# Patient Record
Sex: Female | Born: 1980 | Race: White | Hispanic: No | Marital: Single | Smoking: Current every day smoker
Health system: Southern US, Community
[De-identification: ages and names within clinical notes are randomized; demographics above are authoritative.]

## PROBLEM LIST (undated history)

## (undated) ENCOUNTER — Inpatient Hospital Stay (HOSPITAL_COMMUNITY): Payer: Self-pay

## (undated) DIAGNOSIS — Z87448 Personal history of other diseases of urinary system: Secondary | ICD-10-CM

## (undated) DIAGNOSIS — R87629 Unspecified abnormal cytological findings in specimens from vagina: Secondary | ICD-10-CM

## (undated) DIAGNOSIS — F1021 Alcohol dependence, in remission: Secondary | ICD-10-CM

## (undated) DIAGNOSIS — Z3490 Encounter for supervision of normal pregnancy, unspecified, unspecified trimester: Secondary | ICD-10-CM

## (undated) DIAGNOSIS — Z8619 Personal history of other infectious and parasitic diseases: Secondary | ICD-10-CM

## (undated) DIAGNOSIS — Z789 Other specified health status: Secondary | ICD-10-CM

## (undated) DIAGNOSIS — M419 Scoliosis, unspecified: Secondary | ICD-10-CM

## (undated) DIAGNOSIS — Z3493 Encounter for supervision of normal pregnancy, unspecified, third trimester: Secondary | ICD-10-CM

## (undated) HISTORY — PX: ECTOPIC PREGNANCY SURGERY: SHX613

## (undated) HISTORY — DX: Personal history of other diseases of urinary system: Z87.448

## (undated) HISTORY — PX: CHOLECYSTECTOMY: SHX55

## (undated) HISTORY — DX: Alcohol dependence, in remission: F10.21

## (undated) HISTORY — DX: Personal history of other infectious and parasitic diseases: Z86.19

## (undated) HISTORY — PX: NO PAST SURGERIES: SHX2092

---
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
## 2002-08-01 ENCOUNTER — Emergency Department (HOSPITAL_COMMUNITY): Admission: EM | Admit: 2002-08-01 | Discharge: 2002-08-01 | Payer: Self-pay

## 2002-08-01 ENCOUNTER — Encounter: Payer: Self-pay | Admitting: Emergency Medicine

## 2002-09-06 ENCOUNTER — Encounter (INDEPENDENT_AMBULATORY_CARE_PROVIDER_SITE_OTHER): Payer: Self-pay | Admitting: Specialist

## 2002-09-06 ENCOUNTER — Observation Stay (HOSPITAL_COMMUNITY): Admission: RE | Admit: 2002-09-06 | Discharge: 2002-09-06 | Payer: Self-pay | Admitting: General Surgery

## 2002-09-06 ENCOUNTER — Encounter: Payer: Self-pay | Admitting: General Surgery

## 2002-11-16 ENCOUNTER — Emergency Department (HOSPITAL_COMMUNITY): Admission: AD | Admit: 2002-11-16 | Discharge: 2002-11-16 | Payer: Self-pay | Admitting: Emergency Medicine

## 2003-10-15 ENCOUNTER — Emergency Department (HOSPITAL_COMMUNITY): Admission: EM | Admit: 2003-10-15 | Discharge: 2003-10-15 | Payer: Self-pay | Admitting: Emergency Medicine

## 2003-10-17 ENCOUNTER — Emergency Department (HOSPITAL_COMMUNITY): Admission: EM | Admit: 2003-10-17 | Discharge: 2003-10-17 | Payer: Self-pay | Admitting: Emergency Medicine

## 2006-02-23 HISTORY — PX: SALPINGECTOMY: SHX328

## 2007-09-19 ENCOUNTER — Ambulatory Visit (HOSPITAL_COMMUNITY): Admission: AD | Admit: 2007-09-19 | Discharge: 2007-09-19 | Payer: Self-pay | Admitting: Obstetrics and Gynecology

## 2007-09-19 ENCOUNTER — Encounter: Payer: Self-pay | Admitting: Family Medicine

## 2007-09-19 ENCOUNTER — Ambulatory Visit: Payer: Self-pay | Admitting: Family Medicine

## 2007-09-19 ENCOUNTER — Encounter: Payer: Self-pay | Admitting: Emergency Medicine

## 2007-09-28 ENCOUNTER — Inpatient Hospital Stay (HOSPITAL_COMMUNITY): Admission: AD | Admit: 2007-09-28 | Discharge: 2007-09-28 | Payer: Self-pay | Admitting: Obstetrics & Gynecology

## 2007-10-06 ENCOUNTER — Encounter: Payer: Self-pay | Admitting: Obstetrics & Gynecology

## 2007-10-06 ENCOUNTER — Ambulatory Visit: Payer: Self-pay | Admitting: Obstetrics & Gynecology

## 2008-01-18 ENCOUNTER — Encounter: Payer: Self-pay | Admitting: Physician Assistant

## 2008-01-18 ENCOUNTER — Ambulatory Visit: Payer: Self-pay | Admitting: Obstetrics & Gynecology

## 2008-01-18 LAB — CONVERTED CEMR LAB
Chlamydia, DNA Probe: NEGATIVE
GC Probe Amp, Genital: NEGATIVE
HCV Ab: NEGATIVE
Hepatitis B Surface Ag: NEGATIVE

## 2008-06-23 ENCOUNTER — Inpatient Hospital Stay (HOSPITAL_COMMUNITY): Admission: AD | Admit: 2008-06-23 | Discharge: 2008-06-24 | Payer: Self-pay | Admitting: Family Medicine

## 2008-10-18 ENCOUNTER — Ambulatory Visit: Payer: Self-pay | Admitting: Obstetrics & Gynecology

## 2008-11-06 ENCOUNTER — Inpatient Hospital Stay (HOSPITAL_COMMUNITY): Admission: AD | Admit: 2008-11-06 | Discharge: 2008-11-06 | Payer: Self-pay | Admitting: Obstetrics & Gynecology

## 2008-11-18 ENCOUNTER — Emergency Department (HOSPITAL_COMMUNITY): Admission: EM | Admit: 2008-11-18 | Discharge: 2008-11-18 | Payer: Self-pay | Admitting: Emergency Medicine

## 2009-09-16 ENCOUNTER — Inpatient Hospital Stay (HOSPITAL_COMMUNITY): Admission: AD | Admit: 2009-09-16 | Discharge: 2009-09-16 | Payer: Self-pay | Admitting: Obstetrics and Gynecology

## 2009-09-16 ENCOUNTER — Ambulatory Visit: Payer: Self-pay | Admitting: Obstetrics & Gynecology

## 2009-11-28 ENCOUNTER — Ambulatory Visit: Payer: Self-pay | Admitting: Obstetrics & Gynecology

## 2009-11-28 ENCOUNTER — Inpatient Hospital Stay (HOSPITAL_COMMUNITY): Admission: AD | Admit: 2009-11-28 | Discharge: 2009-11-28 | Payer: Self-pay | Admitting: Obstetrics and Gynecology

## 2009-12-02 ENCOUNTER — Inpatient Hospital Stay (HOSPITAL_COMMUNITY): Admission: AD | Admit: 2009-12-02 | Discharge: 2009-12-03 | Payer: Self-pay | Admitting: Obstetrics and Gynecology

## 2009-12-02 ENCOUNTER — Ambulatory Visit: Payer: Self-pay | Admitting: Family

## 2010-02-17 ENCOUNTER — Inpatient Hospital Stay (HOSPITAL_COMMUNITY)
Admission: AD | Admit: 2010-02-17 | Discharge: 2010-02-20 | Payer: Self-pay | Source: Home / Self Care | Attending: Obstetrics and Gynecology | Admitting: Obstetrics and Gynecology

## 2010-05-05 LAB — CBC
HCT: 30.5 % — ABNORMAL LOW (ref 36.0–46.0)
HCT: 35.6 % — ABNORMAL LOW (ref 36.0–46.0)
Hemoglobin: 10.6 g/dL — ABNORMAL LOW (ref 12.0–15.0)
Hemoglobin: 12.6 g/dL (ref 12.0–15.0)
MCH: 30.5 pg (ref 26.0–34.0)
MCH: 30.7 pg (ref 26.0–34.0)
MCHC: 34.8 g/dL (ref 30.0–36.0)
MCHC: 35.4 g/dL (ref 30.0–36.0)
MCV: 86.8 fL (ref 78.0–100.0)
MCV: 87.9 fL (ref 78.0–100.0)
Platelets: 159 10*3/uL (ref 150–400)
Platelets: 205 10*3/uL (ref 150–400)
RBC: 3.47 MIL/uL — ABNORMAL LOW (ref 3.87–5.11)
RBC: 4.1 MIL/uL (ref 3.87–5.11)
RDW: 13.6 % (ref 11.5–15.5)
RDW: 13.9 % (ref 11.5–15.5)
WBC: 16.1 10*3/uL — ABNORMAL HIGH (ref 4.0–10.5)
WBC: 18.1 10*3/uL — ABNORMAL HIGH (ref 4.0–10.5)

## 2010-05-05 LAB — RPR: RPR Ser Ql: NONREACTIVE

## 2010-05-07 LAB — URINALYSIS, ROUTINE W REFLEX MICROSCOPIC
Bilirubin Urine: NEGATIVE
Bilirubin Urine: NEGATIVE
Glucose, UA: NEGATIVE mg/dL
Glucose, UA: NEGATIVE mg/dL
Hgb urine dipstick: NEGATIVE
Hgb urine dipstick: NEGATIVE
Ketones, ur: NEGATIVE mg/dL
Ketones, ur: NEGATIVE mg/dL
Nitrite: NEGATIVE
Nitrite: NEGATIVE
Protein, ur: NEGATIVE mg/dL
Protein, ur: NEGATIVE mg/dL
Specific Gravity, Urine: 1.01 (ref 1.005–1.030)
Specific Gravity, Urine: <1.005 — ABNORMAL LOW (ref 1.005–1.030)
Urobilinogen, UA: 0.2 mg/dL (ref 0.0–1.0)
Urobilinogen, UA: 0.2 mg/dL (ref 0.0–1.0)
pH: 6 (ref 5.0–8.0)
pH: 6 (ref 5.0–8.0)

## 2010-05-10 LAB — URINALYSIS, ROUTINE W REFLEX MICROSCOPIC
Bilirubin Urine: NEGATIVE
Glucose, UA: NEGATIVE mg/dL
Hgb urine dipstick: NEGATIVE
Ketones, ur: NEGATIVE mg/dL
Nitrite: NEGATIVE
Protein, ur: NEGATIVE mg/dL
Specific Gravity, Urine: 1.01 (ref 1.005–1.030)
Urobilinogen, UA: 0.2 mg/dL (ref 0.0–1.0)
pH: 6.5 (ref 5.0–8.0)

## 2010-05-30 LAB — CBC
HCT: 39.2 % (ref 36.0–46.0)
Hemoglobin: 13.5 g/dL (ref 12.0–15.0)
MCHC: 34.4 g/dL (ref 30.0–36.0)
MCV: 92.8 fL (ref 78.0–100.0)
Platelets: 191 10*3/uL (ref 150–400)
RBC: 4.22 MIL/uL (ref 3.87–5.11)
RDW: 12.6 % (ref 11.5–15.5)
WBC: 9.8 10*3/uL (ref 4.0–10.5)

## 2010-05-30 LAB — WET PREP, GENITAL
Clue Cells Wet Prep HPF POC: NONE SEEN
Trich, Wet Prep: NONE SEEN
Yeast Wet Prep HPF POC: NONE SEEN

## 2010-05-30 LAB — URINALYSIS, ROUTINE W REFLEX MICROSCOPIC
Bilirubin Urine: NEGATIVE
Glucose, UA: NEGATIVE mg/dL
Ketones, ur: NEGATIVE mg/dL
Nitrite: NEGATIVE
Protein, ur: NEGATIVE mg/dL
Specific Gravity, Urine: 1.015 (ref 1.005–1.030)
Urobilinogen, UA: 2 mg/dL — ABNORMAL HIGH (ref 0.0–1.0)
pH: 7 (ref 5.0–8.0)

## 2010-05-30 LAB — DIFFERENTIAL
Basophils Absolute: 0 10*3/uL (ref 0.0–0.1)
Basophils Relative: 0 % (ref 0–1)
Eosinophils Absolute: 0.1 10*3/uL (ref 0.0–0.7)
Eosinophils Relative: 1 % (ref 0–5)
Lymphocytes Relative: 25 % (ref 12–46)
Lymphs Abs: 2.5 10*3/uL (ref 0.7–4.0)
Monocytes Absolute: 0.5 10*3/uL (ref 0.1–1.0)
Monocytes Relative: 5 % (ref 3–12)
Neutro Abs: 6.7 10*3/uL (ref 1.7–7.7)
Neutrophils Relative %: 68 % (ref 43–77)

## 2010-05-30 LAB — GC/CHLAMYDIA PROBE AMP, GENITAL
Chlamydia, DNA Probe: NEGATIVE
GC Probe Amp, Genital: NEGATIVE

## 2010-05-30 LAB — POCT PREGNANCY, URINE: Preg Test, Ur: NEGATIVE

## 2010-05-30 LAB — URINE MICROSCOPIC-ADD ON

## 2010-06-03 LAB — URINALYSIS, ROUTINE W REFLEX MICROSCOPIC
Bilirubin Urine: NEGATIVE
Glucose, UA: NEGATIVE mg/dL
Ketones, ur: 15 mg/dL — AB
Nitrite: POSITIVE — AB
Protein, ur: 100 mg/dL — AB
Specific Gravity, Urine: 1.01 (ref 1.005–1.030)
Urobilinogen, UA: 0.2 mg/dL (ref 0.0–1.0)
pH: 6 (ref 5.0–8.0)

## 2010-06-03 LAB — URINE CULTURE: Colony Count: >=100000

## 2010-06-03 LAB — URINE MICROSCOPIC-ADD ON

## 2010-07-08 NOTE — Op Note (Signed)
NAMELANDREY, MAHURIN                ACCOUNT NO.:  [id number]   MEDICAL RECORD NO.:  [id number]          PATIENT TYPE:  AMB   LOCATION:  [facility]                          FACILITY:  [facility]   PHYSICIAN:  Tanya S. Shawnie Pons, M.D.   DATE OF BIRTH:  [birth year]   DATE OF PROCEDURE:  09/19/2007  DATE OF DISCHARGE:                               OPERATIVE REPORT   PREOPERATIVE DIAGNOSIS:  Right ectopic pregnancy.   POSTOPERATIVE DIAGNOSIS:  Right ectopic pregnancy.   PROCEDURE:  Laparoscopic salpingectomy.   SURGEON:  Shelbie Proctor. Shawnie Pons, MD   ASSISTANT:  Norton Blizzard, MD   ANESTHESIA:  General and local.   FINDINGS:  Right ectopic that was aborted through the end of the tube  and hemoperitoneum approximately 1000 mL.  Specimens include right tube  to pathology.   REASON FOR PROCEDURE:  Briefly, the patient is a 30 year old gravida 2,  para 1, who has 1 vaginal delivery, who did not know she was pregnant,  who came in with significant abdominal pain over the last several hours.  She initially presented to [facility], where she was evaluated  and thought to have an ectopic pregnancy based on beta-hCG of 2999, and  no intrauterine pregnancy as well as complex right mass.  The patient  was also noted to have 2 cysts on the left ovary as well and probable  hemoperitoneum.  The patient was seen by Dr. Candice Camp, who evaluated  her, gave her diagnosis of ectopic pregnancy, and discussed surgical  management with her.  The patient was then transferred to the faculty  practice.   PROCEDURE:  The patient was taken to the operating room, where she was  placed in dorsal lithotomy in Allen stirrups.  She was prepped and  draped in usual sterile fashion.  Foley catheter was placed at the  bladder.  A sponge stick was placed inside the vagina to manipulate the  uterus as this was a wanted pregnancy.  Attention was then turned to the  abdomen.  A 4 mL of 0.25% Marcaine were injected in the umbilicus.   The  umbilicus was elevated.  A knife was used to make an incision through  the skin and fascia.  The peritoneum was then grasped with 2 hemostats  and entered sharply.  The edges of the fascia were then tied with a 0-  Vicryl in the OR-6 and Hasson trocar was placed inside the incision.  Pneumoperitoneum was created.  Upon entry, the patient's  pneumoperitoneum was obviously visible including an enlarged left ovary  with some possible bleeding from outside as well as very enlarged  markedly dilated right tube.  The whole of the tube was involved and  blood was only at the end of the tube.  There were adhesions of the  right tube to the uterus and possibly the pelvic sidewall.  The left  tube appeared grossly normal.  There were no adhesions noted around the  liver.  A 5-mm port was then placed in the right lower quadrant under  direct visualization.  A 10-mm port was placed  on the left side, also  under direct visualization.  The tube was grasped.  The Gyrus was used  with sequential cautery followed by sharp dissection to take the tube  off starting at the cornua.  This was followed back until the whole tube  could be removed.  It was placed inside an EndoCatch bag.  Excellent  hemostasis was noted at the site of tube removal.  Clot was then taken  out of the pelvic cul-de-sac.  The right and left ovaries were examined.  There was strange appearance of some cobblestoning on the outside, plus  probable cyst, and somewhat enlarged bilaterally.  However, there was no  bleeding noticed from the left tube after irrigation and clot removal  were done.  There was still some clot left in the posterior cul-de-sac,  which could not be easily removed.  This was left in the belly.  Hemostasis was felt to be adequate.  The pelvis had been cleaned out  adequately.  The 5- and 10-mm lower quadrant ports were removed under  direct visualization.  The Hasson trocar was also removed at the  umbilical  port and the pneumoperitoneum was let down.  The fascia of the  umbilicus was closed with the aforementioned 0-Vicryl sutures in the OR-  6 with care being given to make sure that no bowel was entrapped in the  suture.  Two figure-of-eights were used to close the fascia.  The skin  was closed with 4-0 Vicryl x1 suture.  The right lower quadrant 5-mm  port was closed with 4-0 Vicryl on a PS-2 in the subcuticular fashion.  A figure-of-eight was placed across the 10-mm port at the level of the  fascia on the left side.  The skin was closed on that side with 4-0  Vicryl in a subcuticular fashion.  Bandages were placed across these.  All instrument, needle, and lap counts were correct x2.  The sponge and  stick was removed from the vagina.  The patient was awakened and taken  to recovery room in stable condition.      Shelbie Proctor. Shawnie Pons, M.D.  Electronically Signed     TSP/MEDQ  D:  09/19/2007  T:  09/20/2007  Job:  [id number]

## 2010-07-11 NOTE — Op Note (Signed)
NAMEROCQUEL, ASKREN                          ACCOUNT NO.:  [id number]   MEDICAL RECORD NO.:  [id number]                   PATIENT TYPE:  OBV   LOCATION:  0350                                 [facility]ACILITY:  [facility]   PHYSICIAN:  Sharlet Salina T. Hoxworth, M.D.          DATE OF BIRTH:  [birth year]   DATE OF PROCEDURE:  09/06/2002  DATE OF DISCHARGE:                                 OPERATIVE REPORT   PREOPERATIVE DIAGNOSES:  Cholelithiasis and cholecystitis.   POSTOPERATIVE DIAGNOSES:  Cholelithiasis and cholecystitis.   PROCEDURE:  Laparoscopic cholecystectomy with intraoperative cholangiogram.   SURGEON:  Sharlet Salina T. Hoxworth, M.D.   ASSISTANT:  Lebron Conners, M.D.   ANESTHESIA:  General.   BRIEF HISTORY:  Ms. Terwilliger is a 30 year old white female who recently  presented with a severe episode of epigastric and right upper quadrant pain,  nausea, and vomiting.  She has had a workup including a gallbladder  ultrasound showing multiple large gallstones and a slightly prominent common  bile duct.  LFTs were normal.  Laparoscopic cholecystectomy with  cholangiogram has been recommended and accepted.  The nature of the  procedure, its indications and risks of bleeding, infection, bile leak, and  bile duct injury were discussed and understood.  She is now brought to the  operating room for this procedure.   DESCRIPTION OF OPERATION:  The patient was brought to the operating room and  placed in supine position on the operating table, and general endotracheal  anesthesia was induced.  She was given preoperative antibiotics.  The  abdomen w sterilely prepped and draped.  Local anesthesia was used to  infiltrate the trocar sites prior to the incision.  A 1 cm incision was made  in the umbilicus and dissection carried down to the midline fascia, which  was sharply incised for 1 cm, the peritoneum entered under direct vision.  Through a mattress suture of 0 Vicryl, the Hasson trocar was placed and  pneumoperitoneum established.  Under direct vision a 10 mm trocar was placed  in the subxiphoid area and two 5 mm trocars in the right subcostal margin.  The gallbladder appeared minimally thickened and had some omental adhesions.  The fundus was elevated up over the liver and omental adhesions taken down  off the infundibulum, which was then retracted inferolaterally.  Fibrofatty  tissue was stripped off the neck of the gallbladder and the peritoneum  anterior and posterior to Calot's triangle was incised.  The cystic duct was  identified as well as the cystic artery, and both were dissected free over  about a centimeters.  The cystic duct-gallbladder junction was dissected 360  degrees.  The cystic artery was divided between two proximal and one distal  clip.  The cystic duct was then clipped at the gallbladder junction and  operative cholangiogram was taken through the cystic duct, which showed a  slightly prominent but within normal limits common bile duct with free  flow  into the duodenum and no filling defects, and the intrahepatic ducts filled  well.  Following this the cholangiocatheter was removed and the cystic  doubly clipped proximally and divided.  The gallbladder was then dissected  free from its bed using hook cautery and removed through the umbilicus.  The  hemostasis was assured in the right upper quadrant.  The mattress suture was secured at the umbilicus and the skin incisions  closed with interrupted 4-0 Monocryl and Steri-Strips.  Sponge, needle, and  instrument counts were correct.  Dry sterile dressings were applied and the  patient taken to recovery in good condition.                                               Lorne Skeens. Hoxworth, M.D.    Tory Emerald  D:  09/06/2002  T:  09/06/2002  Job:  [id number]

## 2010-07-11 NOTE — H&P (Signed)
[facility]  Patient:    Stacy [name], Stacy [name]                       MRN: [id number] Adm. Date:  [id number] Attending:  Esmeralda Arthur Dictator:   Genia Del, M.D.                         History and Physical  PATIENT PROFILE:              The patient is a 30 year old G1, last menstrual period was uncertain, by ultrasound expected date of delivery is February 11, 2000, at 40 weeks 3 days gestation.  REASON FOR ADMISSION:         Elective induction.  HISTORY OF PRESENT ILLNESS:   Fetal movements positive, no vaginal bleeding, no fluid leak, no regular uterine contractions, no PIH symptoms.  The patient was complaining of a generalized rash which was itchy for a few days; it started at the lower abdomen and then it became generalized.  She had no fever and no other complaints.  PAST MEDICAL HISTORY:         Negative.  PAST SURGICAL HISTORY:        Negative.  PAST GYNECOLOGIC HISTORY:     Negative.  ALLERGIES:                    No known drug allergies.  MEDICATIONS:                  Prenatal vitamins.  SOCIAL HISTORY:               Single with supportive father.  A smoker, decreased to about three cigarettes a day.  HISTORY OF PRESENT PREGNANCY:                    Late prenatal care at more than 22 weeks.  The first trimester was unremarkable.  In second trimester she has right acute pyelonephritis treated with Augmentin, no recurrence.  She had her labs done in the second trimester; hemoglobin was 10.3, platelets 392.  Blood type O positive, antibodies negative.  RPR nonreactive.  HBsAg negative.  HIV nonreactive.  Rubella immune.  An ultrasound was done on November 06, 1999, revealing a 26 week 1 day gestation, amniotic fluid was within normal limits, estimated fetal weight was at the 16th percentile and placenta was anterior and normal, review of anatomy was within normal limits.  The patient was advised a high-protein diet.  She was  also started on iron supplements with prenatal vitamins.  At 28 weeks one-hour GTT was within normal limits, and hemoglobin was 11.  At 30+ weeks a repeat ultrasound was done for interval growth which was adequate with an estimated fetal weight at the 17th percentile and amniotic fluid within normal limits.  At 36+ weeks she was diagnosed with bacterial vaginosis and MetroGel was used.  She also took Chiropodist for a yeast infection.  A repeat ultrasound at 36+ weeks showed an estimated fetal weight at the 43rd percentile with an amniotic fluid index at the 59th percentile.  Group B strep came back negative.  Blood pressures remained normal throughout pregnancy.  REVIEW OF SYSTEMS:            Constitutional: Negative.  HEENT: Negative. Respiratory: Negative.  Cardiovascular: Negative.  GI: Negative.  Urology: Negative.  Neurologic: Negative.  Dermatologic: See history of present  illness, generalized pruritic rash.  PHYSICAL EXAMINATION:  GENERAL:                      No apparent distress.  VITAL SIGNS:                  Stable with normal blood pressure, regular pulse and normal temperature.  LUNGS:                        Clear bilaterally.  HEART:                        Regular cardiac rhythm and no murmur.  ABDOMEN:                      Uterine height 38 cm, nontender, soft, cephalic presentation.  VAGINAL EXAMINATION:          On admission showed cervix dilated to 1+ cm, 80% effaced, vertex 0, membranes intact.  Fetal heart rate reactive with a baseline at 130-140, no deceleration, rare uterine contractions.  EXTREMITIES:                  Lower limbs normal, very mild edema, no clonus.  IMPRESSION:                   A 30 year old gravida 1, at 40 weeks 3 days gestation with favorable cervix, group B Streptococcus negative, reassuring fetal well-being for elective induction.  PLAN:                         Admit to labor and delivery, continuous monitoring.  Pitocin induction  with AROM.  Expectant management with probable vaginal delivery. DD:  02/14/00 TD:  02/15/00 Job: [id number]

## 2010-11-01 ENCOUNTER — Inpatient Hospital Stay (HOSPITAL_COMMUNITY)
Admission: AD | Admit: 2010-11-01 | Discharge: 2010-11-02 | Disposition: A | Discharge status: Home / Self Care | Payer: Medicaid Other | Source: Ambulatory Visit | Attending: Obstetrics & Gynecology | Admitting: Obstetrics & Gynecology

## 2010-11-01 DIAGNOSIS — Z1389 Encounter for screening for other disorder: Secondary | ICD-10-CM

## 2010-11-01 DIAGNOSIS — Z3490 Encounter for supervision of normal pregnancy, unspecified, unspecified trimester: Secondary | ICD-10-CM

## 2010-11-01 DIAGNOSIS — O34599 Maternal care for other abnormalities of gravid uterus, unspecified trimester: Secondary | ICD-10-CM | POA: Insufficient documentation

## 2010-11-01 DIAGNOSIS — R109 Unspecified abdominal pain: Secondary | ICD-10-CM

## 2010-11-01 DIAGNOSIS — N83209 Unspecified ovarian cyst, unspecified side: Secondary | ICD-10-CM | POA: Insufficient documentation

## 2010-11-01 HISTORY — DX: Encounter for supervision of normal pregnancy, unspecified, unspecified trimester: Z34.90

## 2010-11-01 NOTE — Progress Notes (Signed)
G4P2. Took 2 HPTs Monday and both positive. Some cramping in abd and back since Monday. Hx ectopic and pain feels somewhat like when had ectopic. Tube removed due to ectopic but unsure which side. Denies bleeding or d/c

## 2010-11-02 ENCOUNTER — Encounter (HOSPITAL_COMMUNITY): Payer: Self-pay | Admitting: *Deleted

## 2010-11-02 ENCOUNTER — Inpatient Hospital Stay (HOSPITAL_COMMUNITY): Payer: Medicaid Other

## 2010-11-02 DIAGNOSIS — Z3490 Encounter for supervision of normal pregnancy, unspecified, unspecified trimester: Secondary | ICD-10-CM

## 2010-11-02 HISTORY — DX: Encounter for supervision of normal pregnancy, unspecified, unspecified trimester: Z34.90

## 2010-11-02 LAB — WET PREP, GENITAL
Clue Cells Wet Prep HPF POC: NONE SEEN
Trich, Wet Prep: NONE SEEN
Yeast Wet Prep HPF POC: NONE SEEN

## 2010-11-02 LAB — CBC
Hemoglobin: 13.4 g/dL (ref 12.0–15.0)
MCH: 30.3 pg (ref 26.0–34.0)
Platelets: 201 10*3/uL (ref 150–400)
RBC: 4.42 MIL/uL (ref 3.87–5.11)
WBC: 11.9 10*3/uL — ABNORMAL HIGH (ref 4.0–10.5)

## 2010-11-02 LAB — URINALYSIS, ROUTINE W REFLEX MICROSCOPIC
Glucose, UA: NEGATIVE mg/dL
Hgb urine dipstick: NEGATIVE
Leukocytes, UA: NEGATIVE
Protein, ur: NEGATIVE mg/dL
pH: 5.5 (ref 5.0–8.0)

## 2010-11-02 LAB — POCT PREGNANCY, URINE: Preg Test, Ur: POSITIVE

## 2010-11-02 MED ORDER — PRENATAL VITAMINS (DIS) PO TABS: 1.0000 | ORAL_TABLET | ORAL | Status: DC

## 2010-11-02 NOTE — ED Provider Notes (Signed)
Chief Complaint:  Abdominal Pain   Stacy [name] is  30 y.o. [id number].  Her pregnancy status is positive.  She presents complaining of Abdominal Pain . Onset is described as sudden and has been present for  2 days. Concerned for abd pain due to hx ectopic. Denies vag bleeding or discharge   Obstetrical/Gynecological History: OB History    Grav Para Term Preterm Abortions TAB SAB Ect Mult Living   3 2 2  0 1 0 0 1 0 2      Past Medical History: No past medical history on file.  Past Surgical History: Past Surgical History  Procedure Date  . Cholecystectomy 7 years ago  . Ectopic pregnancy surgery 2-3 years ago    just tube removal    Family History: No family history on file.  Social History: History  Substance Use Topics  . Smoking status: Current Everyday Smoker    Types: Cigarettes  . Smokeless tobacco: Never Used  . Alcohol Use: No    Allergies: No Known Allergies  No prescriptions prior to admission    Review of Systems - History obtained from the patient General ROS: negative Respiratory ROS: no cough, shortness of breath, or wheezing Cardiovascular ROS: no chest pain or dyspnea on exertion Gastrointestinal ROS: positive for - abdominal pain Genito-Urinary ROS: no dysuria, trouble voiding, or hematuria negative for - vulvar/vaginal symptoms  Physical Exam   Blood pressure 110/69, pulse 98, temperature 98.8 F (37.1 C), temperature source Oral, resp. rate 20, height 5\' 8"  (1.727 m), weight 60.328 kg (133 lb).  General: General appearance - alert, well appearing, and in no distress and normal appearing weight Mental status - alert, oriented to person, place, and time, normal mood, behavior, speech, dress, motor activity, and thought processes, affect appropriate to mood Heart - normal rate, regular rhythm, normal S1, S2, no murmurs, rubs, clicks or gallops Abdomen - soft, nontender, nondistended, no masses or organomegaly Back exam - full range of motion,  no tenderness, palpable spasm or pain on motion Focused Gynecological Exam: VULVA: normal appearing vulva with no masses, tenderness or lesions, VAGINA: normal appearing vagina with normal color and discharge, no lesions, CERVIX: normal appearing cervix without discharge or lesions, UTERUS: uterus is normal size, shape, consistency and nontender, ADNEXA: normal adnexa in size, nontender and no masses  Labs: Recent Results (from the past 24 hour(s))  URINALYSIS, ROUTINE W REFLEX MICROSCOPIC   Collection Time   11/02/10 12:05 AM      Component Value Range   Color, Urine YELLOW  YELLOW    Appearance CLEAR  CLEAR    Specific Gravity, Urine >1.030 (*) 1.005 - 1.030    pH 5.5  5.0 - 8.0    Glucose, UA NEGATIVE  NEGATIVE (mg/dL)   Hgb urine dipstick NEGATIVE  NEGATIVE    Bilirubin Urine NEGATIVE  NEGATIVE    Ketones, ur 15 (*) NEGATIVE (mg/dL)   Protein, ur NEGATIVE  NEGATIVE (mg/dL)   Urobilinogen, UA 0.2  0.0 - 1.0 (mg/dL)   Nitrite NEGATIVE  NEGATIVE    Leukocytes, UA NEGATIVE  NEGATIVE   POCT PREGNANCY, URINE   Collection Time   11/02/10 12:29 AM      Component Value Range   Preg Test, Ur POSITIVE    CBC   Collection Time   11/02/10 12:33 AM      Component Value Range   WBC 11.9 (*) 4.0 - 10.5 (K/uL)   RBC 4.42  3.87 - 5.11 (MIL/uL)   Hemoglobin  13.4  12.0 - 15.0 (g/dL)   HCT 16.1  09.6 - 04.5 (%)   MCV 87.3  78.0 - 100.0 (fL)   MCH 30.3  26.0 - 34.0 (pg)   MCHC 34.7  30.0 - 36.0 (g/dL)   RDW 40.9  81.1 - 91.4 (%)   Platelets 201  150 - 400 (K/uL)  WET PREP, GENITAL   Collection Time   11/02/10 12:49 AM      Component Value Range   Yeast, Wet Prep NONE SEEN  NONE SEEN    Trich, Wet Prep NONE SEEN  NONE SEEN    Clue Cells, Wet Prep NONE SEEN  NONE SEEN    WBC, Wet Prep HPF POC FEW (*) NONE SEEN    Imaging Studies:  RADIOLOGY REPORT*  Clinical Data: Pelvic pain; history of ectopic pregnancy.  OBSTETRIC <14 WK [place] AND TRANSVAGINAL OB US  Technique: Both transabdominal and  transvaginal ultrasound  examinations were performed for complete evaluation of the  gestation as well as the maternal uterus, adnexal regions, and  pelvic cul-de-sac. Transvaginal technique was performed to assess  early pregnancy.  Comparison: Prior pelvic ultrasound performed 09/19/2007  Intrauterine gestational sac: Visualized/normal in shape.  Yolk sac: Yes  Embryo: Yes  Cardiac Activity: Yes  Heart Rate: 114 bpm  CRL: 4.0 mm 6 w 1 d [place] EDC: 06/27/2011  Maternal uterus/adnexae:  A moderate amount of subchorionic hemorrhage is noted; the uterus  is otherwise unremarkable in appearance.  The ovaries are within normal limits. The right ovary measures 3.9  x 2.6 x 3.3 cm, while the left ovary measures 6.0 x 3.3 x 4.1 cm.  A 3.9 cm simple cyst is noted on the left side, within normal  limits.  No free fluid is seen within the pelvic cul-de-sac.  IMPRESSION:  1. Single live intrauterine pregnancy, with a crown-rump length of  4.0 mm, corresponding to a gestational age of [redacted] weeks 1 day. This  matches the gestational age by LMP, and reflects an estimated date  of delivery of Jun 27, 2011.  2. Moderate amount of subchorionic hemorrhage noted.  Original Report Authenticated By: Tonia Ghent, M.D.    Assessment: Viable IUP at [redacted]w[redacted]d with cardiac activity Simple Left ovarian cyst  Plan: Discharge home  Rx Prenatal Vitamins FU with the OB provider of your choice to begin [facility]  Estanislado Surgeon E. 11/02/2010,2:23 AM

## 2010-11-02 NOTE — Treatment Plan (Signed)
Pt expressed the need to leave to pick up her sister from work.  Stated she would return.  Spoke with [employer] CNM, pt may leave and come back.  Pt no show for d/c instructions

## 2010-11-02 NOTE — Treatment Plan (Signed)
Pt returned, d/c instructions given.  Pt encouraged to follow up with ob provider

## 2010-11-03 LAB — GC/CHLAMYDIA PROBE AMP, GENITAL: GC Probe Amp, Genital: NEGATIVE

## 2010-11-21 LAB — URINALYSIS, ROUTINE W REFLEX MICROSCOPIC
Nitrite: NEGATIVE
Specific Gravity, Urine: 1.013
Urobilinogen, UA: 0.2
pH: 6.5

## 2010-11-21 LAB — GC/CHLAMYDIA PROBE AMP, GENITAL: Chlamydia, DNA Probe: POSITIVE — AB

## 2010-11-21 LAB — POCT PREGNANCY, URINE
Operator id: 294511
Preg Test, Ur: POSITIVE

## 2010-11-21 LAB — CBC
Hemoglobin: 11.2 — ABNORMAL LOW
MCHC: 33.9
RBC: 3.61 — ABNORMAL LOW

## 2010-11-21 LAB — DIFFERENTIAL
Basophils Absolute: 0
Basophils Relative: 0
Lymphocytes Relative: 12
Monocytes Absolute: 0.7
Monocytes Relative: 4
Neutro Abs: 14.2 — ABNORMAL HIGH
Neutrophils Relative %: 83 — ABNORMAL HIGH

## 2010-11-21 LAB — URINE CULTURE

## 2010-11-21 LAB — WET PREP, GENITAL: Clue Cells Wet Prep HPF POC: NONE SEEN

## 2010-11-21 LAB — RPR: RPR Ser Ql: NONREACTIVE

## 2011-01-06 LAB — OB RESULTS CONSOLE ABO/RH: RH Type: POSITIVE

## 2011-01-06 LAB — OB RESULTS CONSOLE RUBELLA ANTIBODY, IGM: Rubella: IMMUNE

## 2011-01-06 LAB — OB RESULTS CONSOLE RPR: RPR: NONREACTIVE

## 2011-01-06 LAB — OB RESULTS CONSOLE HIV ANTIBODY (ROUTINE TESTING): HIV: NONREACTIVE

## 2011-02-24 NOTE — L&D Delivery Note (Signed)
Delivery Note At 5:27 AM a viable female was delivered via Vaginal, Spontaneous Delivery (Presentation:OA ;LOT  ).  APGAR: 9,9 ; weight P .   Placenta status: delivered , intact.  Cord:  with the following complications: none.    Anesthesia:  epidural Episiotomy: none Lacerations: R labial Suture Repair: 3.0 vicryl rapide Est. Blood Loss (mL): 500cc  Mom to postpartum.  Baby to nursery-stable.  BOVARD,Rudine Rieger 06/18/2011, 5:42 AM  Bo/O+/ OCPs

## 2011-06-01 LAB — OB RESULTS CONSOLE GBS: GBS: NEGATIVE

## 2011-06-16 ENCOUNTER — Inpatient Hospital Stay (HOSPITAL_COMMUNITY)
Admission: AD | Admit: 2011-06-16 | Discharge: 2011-06-17 | DRG: 782 | Disposition: A | Discharge status: Home / Self Care | Payer: Medicaid Other | Source: Ambulatory Visit | Attending: Obstetrics and Gynecology | Admitting: Obstetrics and Gynecology

## 2011-06-16 ENCOUNTER — Encounter (HOSPITAL_COMMUNITY): Payer: Self-pay | Admitting: *Deleted

## 2011-06-16 DIAGNOSIS — O4700 False labor before 37 completed weeks of gestation, unspecified trimester: Secondary | ICD-10-CM | POA: Diagnosis present

## 2011-06-16 DIAGNOSIS — O368390 Maternal care for abnormalities of the fetal heart rate or rhythm, unspecified trimester, not applicable or unspecified: Secondary | ICD-10-CM | POA: Diagnosis present

## 2011-06-16 HISTORY — DX: Scoliosis, unspecified: M41.9

## 2011-06-16 NOTE — Progress Notes (Signed)
Called Dr. Jackelyn Knife about triage pt. MD aware. Reported SVE, FHR, and UC's. MD orders to walk pt x1 hour and recheck cervix.

## 2011-06-17 ENCOUNTER — Inpatient Hospital Stay (HOSPITAL_COMMUNITY)
Admission: AD | Admit: 2011-06-17 | Discharge: 2011-06-19 | DRG: 775 | Disposition: A | Discharge status: Home / Self Care | Payer: Medicaid Other | Source: Ambulatory Visit | Attending: Obstetrics and Gynecology | Admitting: Obstetrics and Gynecology

## 2011-06-17 ENCOUNTER — Encounter (HOSPITAL_COMMUNITY): Payer: Self-pay | Admitting: *Deleted

## 2011-06-17 HISTORY — DX: Other specified health status: Z78.9

## 2011-06-17 NOTE — Progress Notes (Signed)
FHT from 4-23 reviewed.  Reactive NST, irreg ctx, no significant decels.   

## 2011-06-17 NOTE — Progress Notes (Signed)
Discharge instructions given to patient instructed to follow up in office tomorrow. Pt verbalized understanding. Pt has no questions.

## 2011-06-17 NOTE — Progress Notes (Signed)
Called Dr. Jackelyn Knife and reported repeated SVE and UC pattern. Orders to discharge pt home and have pt follow up in the office.

## 2011-06-17 NOTE — MAU Note (Signed)
Pt G4 P2 at 38.4wks, having contractions and feeling pressure. Gush of fluid around 1530, then passed yellow mucous.  Denies problems with pregnancy.

## 2011-06-17 NOTE — Discharge Instructions (Signed)
Fetal Movement Counts Patient Name: __________________________________________________ Patient Due Date: ____________________ Kick counts is highly recommended in high risk pregnancies, but it is a good idea for every pregnant woman to do. Start counting fetal movements at 28 weeks of the pregnancy. Fetal movements increase after eating a full meal or eating or drinking something sweet (the blood sugar is higher). It is also important to drink plenty of fluids (well hydrated) before doing the count. Lie on your left side because it helps with the circulation or you can sit in a comfortable chair with your arms over your belly (abdomen) with no distractions around you. DOING THE COUNT  Try to do the count the same time of day each time you do it.   Mark the day and time, then see how long it takes for you to feel 10 movements (kicks, flutters, swishes, rolls). You should have at least 10 movements within 2 hours. You will most likely feel 10 movements in much less than 2 hours. If you do not, wait an hour and count again. After a couple of days you will see a pattern.   What you are looking for is a change in the pattern or not enough counts in 2 hours. Is it taking longer in time to reach 10 movements?  SEEK MEDICAL CARE IF:  You feel less than 10 counts in 2 hours. Tried twice.   No movement in one hour.   The pattern is changing or taking longer each day to reach 10 counts in 2 hours.   You feel the baby is not moving as it usually does.  Date: ____________ Movements: ____________ Start time: ____________ Finish time: ____________  Date: ____________ Movements: ____________ Start time: ____________ Finish time: ____________ Date: ____________ Movements: ____________ Start time: ____________ Finish time: ____________ Date: ____________ Movements: ____________ Start time: ____________ Finish time: ____________ Date: ____________ Movements: ____________ Start time: ____________ Finish time:  ____________ Date: ____________ Movements: ____________ Start time: ____________ Finish time: ____________ Date: ____________ Movements: ____________ Start time: ____________ Finish time: ____________ Date: ____________ Movements: ____________ Start time: ____________ Finish time: ____________  Date: ____________ Movements: ____________ Start time: ____________ Finish time: ____________ Date: ____________ Movements: ____________ Start time: ____________ Finish time: ____________ Date: ____________ Movements: ____________ Start time: ____________ Finish time: ____________ Date: ____________ Movements: ____________ Start time: ____________ Finish time: ____________ Date: ____________ Movements: ____________ Start time: ____________ Finish time: ____________ Date: ____________ Movements: ____________ Start time: ____________ Finish time: ____________ Date: ____________ Movements: ____________ Start time: ____________ Finish time: ____________  Date: ____________ Movements: ____________ Start time: ____________ Finish time: ____________ Date: ____________ Movements: ____________ Start time: ____________ Finish time: ____________ Date: ____________ Movements: ____________ Start time: ____________ Finish time: ____________ Date: ____________ Movements: ____________ Start time: ____________ Finish time: ____________ Date: ____________ Movements: ____________ Start time: ____________ Finish time: ____________ Date: ____________ Movements: ____________ Start time: ____________ Finish time: ____________ Date: ____________ Movements: ____________ Start time: ____________ Finish time: ____________  Date: ____________ Movements: ____________ Start time: ____________ Finish time: ____________ Date: ____________ Movements: ____________ Start time: ____________ Finish time: ____________ Date: ____________ Movements: ____________ Start time: ____________ Finish time: ____________ Date: ____________ Movements:  ____________ Start time: ____________ Finish time: ____________ Date: ____________ Movements: ____________ Start time: ____________ Finish time: ____________ Date: ____________ Movements: ____________ Start time: ____________ Finish time: ____________ Date: ____________ Movements: ____________ Start time: ____________ Finish time: ____________  Date: ____________ Movements: ____________ Start time: ____________ Finish time: ____________ Date: ____________ Movements: ____________ Start time: ____________ Finish time: ____________ Date: ____________ Movements: ____________ Start time:   ____________ Finish time: ____________ Date: ____________ Movements: ____________ Start time: ____________ Finish time: ____________ Date: ____________ Movements: ____________ Start time: ____________ Finish time: ____________ Date: ____________ Movements: ____________ Start time: ____________ Finish time: ____________ Date: ____________ Movements: ____________ Start time: ____________ Finish time: ____________  Date: ____________ Movements: ____________ Start time: ____________ Finish time: ____________ Date: ____________ Movements: ____________ Start time: ____________ Finish time: ____________ Date: ____________ Movements: ____________ Start time: ____________ Finish time: ____________ Date: ____________ Movements: ____________ Start time: ____________ Finish time: ____________ Date: ____________ Movements: ____________ Start time: ____________ Finish time: ____________ Date: ____________ Movements: ____________ Start time: ____________ Finish time: ____________ Date: ____________ Movements: ____________ Start time: ____________ Finish time: ____________  Date: ____________ Movements: ____________ Start time: ____________ Finish time: ____________ Date: ____________ Movements: ____________ Start time: ____________ Finish time: ____________ Date: ____________ Movements: ____________ Start time: ____________ Finish  time: ____________ Date: ____________ Movements: ____________ Start time: ____________ Finish time: ____________ Date: ____________ Movements: ____________ Start time: ____________ Finish time: ____________ Date: ____________ Movements: ____________ Start time: ____________ Finish time: ____________ Date: ____________ Movements: ____________ Start time: ____________ Finish time: ____________  Date: ____________ Movements: ____________ Start time: ____________ Finish time: ____________ Date: ____________ Movements: ____________ Start time: ____________ Finish time: ____________ Date: ____________ Movements: ____________ Start time: ____________ Finish time: ____________ Date: ____________ Movements: ____________ Start time: ____________ Finish time: ____________ Date: ____________ Movements: ____________ Start time: ____________ Finish time: ____________ Date: ____________ Movements: ____________ Start time: ____________ Finish time: ____________ Document Released: 03/11/2006 Document Revised: 01/29/2011 Document Reviewed: 09/11/2008 [employer] Patient Information 2012 [employer].Braxton Hicks Contractions Pregnancy is commonly associated with contractions of the uterus throughout the pregnancy. Towards the end of pregnancy (32 to 34 weeks), these contractions (Braxton Hicks) can develop more often and may become more forceful. This is not true labor because these contractions do not result in opening (dilatation) and thinning of the cervix. They are sometimes difficult to tell apart from true labor because these contractions can be forceful and people have different pain tolerances. You should not feel embarrassed if you go to the hospital with false labor. Sometimes, the only way to tell if you are in true labor is for your caregiver to follow the changes in the cervix. How to tell the difference between true and false labor:  False labor.   The contractions of false labor are usually shorter,  irregular and not as hard as those of true labor.   They are often felt in the front of the lower abdomen and in the groin.   They may leave with walking around or changing positions while lying down.   They get weaker and are shorter lasting as time goes on.   These contractions are usually irregular.   They do not usually become progressively stronger, regular and closer together as with true labor.   True labor.   Contractions in true labor last 30 to 70 seconds, become very regular, usually become more intense, and increase in frequency.   They do not go away with walking.   The discomfort is usually felt in the top of the uterus and spreads to the lower abdomen and low back.   True labor can be determined by your caregiver with an exam. This will show that the cervix is dilating and getting thinner.  If there are no prenatal problems or other health problems associated with the pregnancy, it is completely safe to be sent home with false labor and await the onset of true labor. HOME CARE INSTRUCTIONS   Keep up   with your usual exercises and instructions.   Take medications as directed.   Keep your regular prenatal appointment.   Eat and drink lightly if you think you are going into labor.   If BH contractions are making you uncomfortable:   Change your activity position from lying down or resting to walking/walking to resting.   Sit and rest in a tub of warm water.   Drink 2 to 3 glasses of water. Dehydration may cause B-H contractions.   Do slow and deep breathing several times an hour.  SEEK IMMEDIATE MEDICAL CARE IF:   Your contractions continue to become stronger, more regular, and closer together.   You have a gushing, burst or leaking of fluid from the vagina.   An oral temperature above 102 F (38.9 C) develops.   You have passage of blood-tinged mucus.   You develop vaginal bleeding.   You develop continuous belly (abdominal) pain.   You have low  back pain that you never had before.   You feel the baby's head pushing down causing pelvic pressure.   The baby is not moving as much as it used to.  Document Released: 02/09/2005 Document Revised: 01/29/2011 Document Reviewed: 08/03/2008 [employer] Patient Information 2012 [employer] 

## 2011-06-18 ENCOUNTER — Inpatient Hospital Stay (HOSPITAL_COMMUNITY): Payer: Medicaid Other | Admitting: Anesthesiology

## 2011-06-18 ENCOUNTER — Encounter (HOSPITAL_COMMUNITY): Payer: Self-pay | Admitting: *Deleted

## 2011-06-18 ENCOUNTER — Encounter (HOSPITAL_COMMUNITY): Payer: Self-pay | Admitting: Anesthesiology

## 2011-06-18 LAB — RPR: RPR Ser Ql: NONREACTIVE

## 2011-06-18 LAB — CBC
Hemoglobin: 11.3 g/dL — ABNORMAL LOW (ref 12.0–15.0)
MCH: 30.4 pg (ref 26.0–34.0)
MCHC: 34.1 g/dL (ref 30.0–36.0)

## 2011-06-18 MED ORDER — LACTATED RINGERS IV SOLN: INTRAVENOUS | Status: DC

## 2011-06-18 MED ORDER — OXYTOCIN 20 UNITS IN LACTATED RINGERS INFUSION - SIMPLE
1.0000 m[IU]/min | INTRAVENOUS | Status: DC
  Administered 2011-06-18: 2 m[IU]/min via INTRAVENOUS

## 2011-06-18 MED ORDER — TETANUS-DIPHTH-ACELL PERTUSSIS 5-2.5-18.5 LF-MCG/0.5 IM SUSP
0.5000 mL | Freq: Once | INTRAMUSCULAR | Status: AC
  Administered 2011-06-19: 0.5 mL via INTRAMUSCULAR
  Filled 2011-06-18: qty 0.5

## 2011-06-18 MED ORDER — ONDANSETRON HCL 4 MG PO TABS: 4.0000 mg | ORAL_TABLET | ORAL | Status: DC | PRN

## 2011-06-18 MED ORDER — PRENATAL MULTIVITAMIN CH
1.0000 | ORAL_TABLET | Freq: Every day | ORAL | Status: DC
  Administered 2011-06-18 – 2011-06-19 (×2): 1 via ORAL
  Filled 2011-06-18 (×2): qty 1

## 2011-06-18 MED ORDER — ONDANSETRON HCL 4 MG/2ML IJ SOLN: 4.0000 mg | INTRAMUSCULAR | Status: DC | PRN

## 2011-06-18 MED ORDER — SIMETHICONE 80 MG PO CHEW: 80.0000 mg | CHEWABLE_TABLET | ORAL | Status: DC | PRN

## 2011-06-18 MED ORDER — IBUPROFEN 600 MG PO TABS: 600.0000 mg | ORAL_TABLET | Freq: Four times a day (QID) | ORAL | Status: DC | PRN

## 2011-06-18 MED ORDER — LACTATED RINGERS IV SOLN: 500.0000 mL | Freq: Once | INTRAVENOUS | Status: DC

## 2011-06-18 MED ORDER — EPHEDRINE 5 MG/ML INJ
10.0000 mg | INTRAVENOUS | Status: DC | PRN
  Filled 2011-06-18: qty 2

## 2011-06-18 MED ORDER — FENTANYL 2.5 MCG/ML BUPIVACAINE 1/10 % EPIDURAL INFUSION (WH - ANES)
14.0000 mL/h | INTRAMUSCULAR | Status: DC
  Administered 2011-06-18: 14 mL/h via EPIDURAL
  Filled 2011-06-18: qty 60

## 2011-06-18 MED ORDER — PRENATAL MULTIVITAMIN CH: 1.0000 | ORAL_TABLET | Freq: Every day | ORAL | Status: DC

## 2011-06-18 MED ORDER — DIBUCAINE 1 % RE OINT: 1.0000 "application " | TOPICAL_OINTMENT | RECTAL | Status: DC | PRN

## 2011-06-18 MED ORDER — OXYTOCIN 20 UNITS IN LACTATED RINGERS INFUSION - SIMPLE: 1.0000 m[IU]/min | INTRAVENOUS | Status: DC

## 2011-06-18 MED ORDER — CITRIC ACID-SODIUM CITRATE 334-500 MG/5ML PO SOLN: 30.0000 mL | ORAL | Status: DC | PRN

## 2011-06-18 MED ORDER — LIDOCAINE HCL (PF) 1 % IJ SOLN
30.0000 mL | INTRAMUSCULAR | Status: DC | PRN
  Filled 2011-06-18 (×2): qty 30

## 2011-06-18 MED ORDER — LACTATED RINGERS IV SOLN: 500.0000 mL | INTRAVENOUS | Status: DC | PRN

## 2011-06-18 MED ORDER — LANOLIN HYDROUS EX OINT: TOPICAL_OINTMENT | CUTANEOUS | Status: DC | PRN

## 2011-06-18 MED ORDER — DIPHENHYDRAMINE HCL 25 MG PO CAPS: 25.0000 mg | ORAL_CAPSULE | Freq: Four times a day (QID) | ORAL | Status: DC | PRN

## 2011-06-18 MED ORDER — OXYTOCIN 20 UNITS IN LACTATED RINGERS INFUSION - SIMPLE
125.0000 mL/h | Freq: Once | INTRAVENOUS | Status: DC
Start: 2011-06-18 — End: 2011-06-18

## 2011-06-18 MED ORDER — ONDANSETRON HCL 4 MG/2ML IJ SOLN: 4.0000 mg | Freq: Four times a day (QID) | INTRAMUSCULAR | Status: DC | PRN

## 2011-06-18 MED ORDER — SENNOSIDES-DOCUSATE SODIUM 8.6-50 MG PO TABS
2.0000 | ORAL_TABLET | Freq: Every day | ORAL | Status: DC
  Administered 2011-06-18: 2 via ORAL

## 2011-06-18 MED ORDER — TERBUTALINE SULFATE 1 MG/ML IJ SOLN: 0.2500 mg | Freq: Once | INTRAMUSCULAR | Status: DC | PRN

## 2011-06-18 MED ORDER — OXYTOCIN BOLUS FROM INFUSION
500.0000 mL | Freq: Once | INTRAVENOUS | Status: DC
  Filled 2011-06-18: qty 1000
  Filled 2011-06-18: qty 500

## 2011-06-18 MED ORDER — OXYCODONE-ACETAMINOPHEN 5-325 MG PO TABS
1.0000 | ORAL_TABLET | ORAL | Status: DC | PRN
  Administered 2011-06-18 – 2011-06-19 (×2): 1 via ORAL
  Filled 2011-06-18 (×2): qty 1

## 2011-06-18 MED ORDER — ACETAMINOPHEN 325 MG PO TABS: 650.0000 mg | ORAL_TABLET | ORAL | Status: DC | PRN

## 2011-06-18 MED ORDER — EPHEDRINE 5 MG/ML INJ
10.0000 mg | INTRAVENOUS | Status: DC | PRN
  Filled 2011-06-18: qty 2
  Filled 2011-06-18: qty 4

## 2011-06-18 MED ORDER — BENZOCAINE-MENTHOL 20-0.5 % EX AERO: 1.0000 "application " | INHALATION_SPRAY | CUTANEOUS | Status: DC | PRN

## 2011-06-18 MED ORDER — IBUPROFEN 100 MG/5ML PO SUSP
600.0000 mg | Freq: Four times a day (QID) | ORAL | Status: DC
  Administered 2011-06-18 – 2011-06-19 (×4): 600 mg via ORAL
  Filled 2011-06-18 (×5): qty 30

## 2011-06-18 MED ORDER — LIDOCAINE HCL (PF) 1 % IJ SOLN
INTRAMUSCULAR | Status: DC | PRN
  Administered 2011-06-18 (×3): 4 mL

## 2011-06-18 MED ORDER — WITCH HAZEL-GLYCERIN EX PADS: 1.0000 "application " | MEDICATED_PAD | CUTANEOUS | Status: DC | PRN

## 2011-06-18 MED ORDER — LACTATED RINGERS IV SOLN
INTRAVENOUS | Status: DC
  Administered 2011-06-18 (×2): via INTRAVENOUS

## 2011-06-18 MED ORDER — OXYMETAZOLINE HCL 0.05 % NA SOLN
2.0000 | Freq: Two times a day (BID) | NASAL | Status: DC
  Administered 2011-06-18: 2 via NASAL
  Filled 2011-06-18: qty 15

## 2011-06-18 MED ORDER — IBUPROFEN 600 MG PO TABS
600.0000 mg | ORAL_TABLET | Freq: Four times a day (QID) | ORAL | Status: DC
  Filled 2011-06-18: qty 1

## 2011-06-18 MED ORDER — ZOLPIDEM TARTRATE 5 MG PO TABS: 5.0000 mg | ORAL_TABLET | Freq: Every evening | ORAL | Status: DC | PRN

## 2011-06-18 MED ORDER — PHENYLEPHRINE 40 MCG/ML (10ML) SYRINGE FOR IV PUSH (FOR BLOOD PRESSURE SUPPORT)
80.0000 ug | PREFILLED_SYRINGE | INTRAVENOUS | Status: DC | PRN
  Filled 2011-06-18: qty 5
  Filled 2011-06-18: qty 2

## 2011-06-18 MED ORDER — OXYCODONE-ACETAMINOPHEN 5-325 MG PO TABS: 1.0000 | ORAL_TABLET | ORAL | Status: DC | PRN

## 2011-06-18 MED ORDER — FLEET ENEMA 7-19 GM/118ML RE ENEM: 1.0000 | ENEMA | RECTAL | Status: DC | PRN

## 2011-06-18 MED ORDER — DIPHENHYDRAMINE HCL 50 MG/ML IJ SOLN: 12.5000 mg | INTRAMUSCULAR | Status: DC | PRN

## 2011-06-18 MED ORDER — BUTORPHANOL TARTRATE 2 MG/ML IJ SOLN: 2.0000 mg | INTRAMUSCULAR | Status: DC | PRN

## 2011-06-18 MED ORDER — PHENYLEPHRINE 40 MCG/ML (10ML) SYRINGE FOR IV PUSH (FOR BLOOD PRESSURE SUPPORT)
80.0000 ug | PREFILLED_SYRINGE | INTRAVENOUS | Status: DC | PRN
  Filled 2011-06-18: qty 2

## 2011-06-18 NOTE — Progress Notes (Signed)
Paighton CHAU SAVELL is a 31 y.o. [phone] at [redacted]w[redacted]d admitted for active labor  Subjective: Doing well  Objective: BP 118/69  Pulse 90  Temp(Src) 97.7 F (36.5 C) (Axillary)  Resp 18  Ht 5\' 8"  (1.727 m)  Wt 74.027 kg (163 lb 3.2 oz)  BMI 24.81 kg/m2  LMP 09/20/2010      FHT:  FHR: 130 bpm, variability: moderate,  accelerations:  Present,  decelerations:  Absent UC:   regular, every 2-4 minutes SVE:   Dilation: 6 Effacement (%): 80 Station: -1/0 Exam by:: Dr Ellyn Hack  Labs: Lab Results  Component Value Date   WBC 16.3* 06/18/2011   HGB 11.3* 06/18/2011   HCT 33.1* 06/18/2011   MCV 89.0 06/18/2011   PLT 190 06/18/2011    Assessment / Plan: Spontaneous labor, progressing normally  Labor: Progressing normally AROM for clear fluid w/o diff/comp Preeclampsia:  no signs or symptoms of toxicity Fetal Wellbeing:  Category II Pain Control:  Epidural and IV pain meds prn I/D:  n/a Anticipated MOD:  NSVD  BOVARD,Arionne Iams 06/18/2011, 1:42 AM

## 2011-06-18 NOTE — Anesthesia Preprocedure Evaluation (Signed)
Anesthesia Evaluation  Patient identified by MRN, date of birth, ID band Patient awake    Reviewed: Allergy & Precautions, H&P , NPO status , Patient's Chart, lab work & pertinent test results, reviewed documented beta blocker date and time   History of Anesthesia Complications Negative for: history of anesthetic complications  Airway Mallampati: I TM Distance: >3 FB Neck ROM: full    Dental  (+) Chipped   Pulmonary Current Smoker,  breath sounds clear to auscultation        Cardiovascular negative cardio ROS  Rhythm:regular Rate:Normal     Neuro/Psych negative neurological ROS  negative psych ROS   GI/Hepatic negative GI ROS, Neg liver ROS,   Endo/Other  negative endocrine ROS  Renal/GU negative Renal ROS     Musculoskeletal   Abdominal   Peds  Hematology negative hematology ROS (+)   Anesthesia Other Findings   Reproductive/Obstetrics (+) Pregnancy                           Anesthesia Physical Anesthesia Plan  ASA: II  Anesthesia Plan: Epidural   Post-op Pain Management:    Induction:   Airway Management Planned:   Additional Equipment:   Intra-op Plan:   Post-operative Plan:   Informed Consent: I have reviewed the patients History and Physical, chart, labs and discussed the procedure including the risks, benefits and alternatives for the proposed anesthesia with the patient or authorized representative who has indicated his/her understanding and acceptance.     Plan Discussed with:   Anesthesia Plan Comments:         Anesthesia Quick Evaluation

## 2011-06-18 NOTE — Progress Notes (Signed)
UR chart review completed.  

## 2011-06-18 NOTE — H&P (Signed)
Stacy [name] is a 31 y.o. female [phone] at 38+ with contractions and cervical change.  +FM, no LOF, no VB, ctx increasing in intensity and frequency.Pregnancy complcated by late prenatal care. Maternal Medical History:  Reason for admission: Reason for admission: contractions.  Fetal activity: Perceived fetal activity is normal.      OB History    Grav Para Term Preterm Abortions TAB SAB Ect Mult Living   4 2 2  0 1 0 0 1 0 2    G1 SVD female, 8#3, G2 ectopic - salpingectomy, G3 SVD 7#5 female, G4 present; abn pap, colpo, nl since, h/o chlamydia Past Medical History  Diagnosis Date  . Supervision of normal pregnancy 11/02/2010  . Scoliosis     mild  . No pertinent past medical history   back pain, depression, postpartum depression Past Surgical History  Procedure Date  . Cholecystectomy 7 years ago  . Ectopic pregnancy surgery 2-3 years ago    just tube removal  . Salpingectomy 2008    right  . No past surgeries    Family History: family history includes Depression in her father and Diabetes in her father.alcoholism, aneursym, breast CA, CVA, Depression, DVT, Emphysema, HTN, Thyroid dz Social History:  reports that she has been smoking Cigarettes.  She has been smoking about .5 packs per day. She has never used smokeless tobacco. She reports that she does not drink alcohol or use illicit drugs.h/o MJ  Meds PNV All: NKDA   Review of Systems  Constitutional: Negative.   HENT: Negative.   Eyes: Negative.   Respiratory: Negative.   Cardiovascular: Negative.   Gastrointestinal: Negative.   Genitourinary: Negative.   Musculoskeletal: Negative.   Skin: Negative.   Neurological: Negative.   Psychiatric/Behavioral: Negative.     Dilation: 5 Effacement (%): 80 Station: -2 Exam by:: M. Topp, RN Blood pressure 123/70, pulse 107, temperature 97 F (36.1 C), temperature source Oral, resp. rate 18, height 5\' 8"  (1.727 m), weight 74.027 kg (163 lb 3.2 oz), last menstrual period  09/20/2010. Maternal Exam:  Uterine Assessment: Contraction strength is moderate.  Abdomen: Fundal height is appropriate for gestation.   Estimated fetal weight is 7-8#.   Fetal presentation: vertex     Physical Exam  Constitutional: She is oriented to person, place, and time. She appears well-developed and well-nourished.  HENT:  Head: Normocephalic and atraumatic.  Neck: Normal range of motion. Neck supple. No thyromegaly present.  Cardiovascular: Normal rate and regular rhythm.   Respiratory: Effort normal and breath sounds normal. No respiratory distress.  GI: Soft. Bowel sounds are normal. She exhibits no distension. There is no tenderness.  Musculoskeletal: Normal range of motion.  Neurological: She is alert and oriented to person, place, and time.  Skin: Skin is warm and dry.  Psychiatric: She has a normal mood and affect. Her behavior is normal.    Prenatal labs: ABO, Rh: O/Positive/-- (11/13 0000) Antibody:  negative Rubella: Immune (11/13 0000) RPR: Nonreactive (11/13 0000)  HBsAg:   negative HIV: Non-reactive (11/13 0000)  GBS: Negative (04/08 0000)  Hgb 13.6/ Pap WNL/ Plt 197K/GC neg/ Chl neg/ First tri screen too late to care/ AFP WNL/ CF neg/ TSH neg/ glucola 103  18wk nl anat, x limited heart, post plac, female 23 week completes anat  Assessment/Plan: 31yo [id number] at 38+ in active labor Cervical change in MAU gbbs negative, no prophylaxis Pitocin to augment prn Epidural for pain prn   Stacy [name] 06/18/2011, 1:01 AM

## 2011-06-18 NOTE — Anesthesia Procedure Notes (Signed)

## 2011-06-18 NOTE — Anesthesia Postprocedure Evaluation (Signed)
  Anesthesia Post-op Note  Patient: Stacy [name]  Procedure(s) Performed: * No procedures listed *  Patient Location: PACU and Mother/Baby  Anesthesia Type: Epidural  Level of Consciousness: awake, alert  and oriented  Airway and Oxygen Therapy: Patient Spontanous Breathing  Post-op Pain: mild  Post-op Assessment: Patient's Cardiovascular Status Stable, Respiratory Function Stable, Adequate PO intake, No headache, No backache, No residual numbness and No residual motor weakness  Post-op Vital Signs: stable  Complications: No apparent anesthesia complications

## 2011-06-18 NOTE — Progress Notes (Signed)
Clinical Social Work Department  BRIEF PSYCHOSOCIAL ASSESSMENT  06/18/2011  Patient: TIFFANY, CALMES Account Number: [id number] Admit date: 06/17/2011  Clinical Social Worker: Andy Gauss Date/Time: 06/18/2011 11:00 AM  Referred by: Physician Date Referred: 06/18/2011  Referred for   Behavioral Health Issues   Other Referral:  History of PP depression   Interview type: Patient  Other interview type:  PSYCHOSOCIAL DATA  Living Status: FAMILY  Admitted from facility:  Level of care:  Primary support name: Raynald Blend  Primary support relationship to patient: FRIEND  Degree of support available:  Involved   CURRENT CONCERNS  Current Concerns   Behavioral Health Issues   Other Concerns:  SOCIAL WORK ASSESSMENT / PLAN  Sw met with pt to assess history of PP depression. Pt acknowledges that she experienced PP depression after the birth of her son however states it was primarily due to custody battle, at that time. Pt told Sw that she was young, still in high school and did not have the financial means to support a child. As a result, a judge awarded full custody to her son's father, as he was older and established. Her symptoms were treated with Prozac for about 1 month before she stopped medication. She denies any depression symptoms since then. No SI history. FOB is at the bedside and supportive. She has all the necessary supplies for the infant. Pt lives with FOB, their daughter and his family. She appears to be appropriate at this time. Sw available to assist further if needed.   Assessment/plan status: No Further Intervention Required  Other assessment/ plan:  Information/referral to community resources:  PATIENT'S/FAMILY'S RESPONSE TO PLAN OF CARE:  Pt and FOB thanked Sw for consult.

## 2011-06-19 LAB — CBC
HCT: 34 % — ABNORMAL LOW (ref 36.0–46.0)
Hemoglobin: 11.5 g/dL — ABNORMAL LOW (ref 12.0–15.0)
MCH: 30.3 pg (ref 26.0–34.0)
MCHC: 33.8 g/dL (ref 30.0–36.0)
MCV: 89.7 fL (ref 78.0–100.0)
RBC: 3.79 MIL/uL — ABNORMAL LOW (ref 3.87–5.11)

## 2011-06-19 MED ORDER — IBUPROFEN 600 MG PO TABS: 600.0000 mg | ORAL_TABLET | Freq: Four times a day (QID) | ORAL | Status: AC | PRN

## 2011-06-19 MED ORDER — OXYCODONE-ACETAMINOPHEN 5-325 MG PO TABS: 1.0000 | ORAL_TABLET | ORAL | Status: AC | PRN

## 2011-06-19 NOTE — Discharge Instructions (Signed)
booklet °

## 2011-06-19 NOTE — Progress Notes (Signed)
Patient ID: Stacy [name], female   DOB: Feb 07, 1981, 31 y.o.   MRN: [id number] #1 afebrile for d/c Pt wants early d/c because she has a 4 month old at home.

## 2011-06-19 NOTE — Discharge Summary (Signed)
[name], [name]                     ACCOUNT NO.:  MEDICAL RECORD NO.:  [id number]  LOCATION:                                 FACILITY:  PHYSICIAN:  Malachi Pro. Stacy [name], M.D. DATE OF BIRTH:  [birth year]  DATE OF ADMISSION:  06/18/2011 DATE OF DISCHARGE:  06/19/2011                              DISCHARGE SUMMARY   This is a 31 year old white female, para 2-0-1-2, gravida 4, at 38+ weeks gestation with contractions and cervical change, admitted for delivery.  Blood group and type O positive, negative antibody, rubella immune, RPR nonreactive, hepatitis B surface antigen negative, HIV nonreactive, group B strep negative.  GC and Chlamydia negative.  First trimester screen too late to care.  AFP was normal.  Cystic fibrosis negative.  TSH was normal.  Glucola was 103.  The patient has had an ectopic pregnancy in the past.  She delivered an 8 pounds 3 ounces and 7 pounds 5 ounces infants vaginally.  She has a history of scoliosis.  She has had a cholecystectomy, ectopic pregnancy surgery with salpingectomy. She did smoke cigarettes 5 a day.  She does not drink and did not use illicit drugs.  On admission, her blood pressure was 123/70, pulse 107, temperature 97, respirations 18.  After admission to the hospital by 1:42 a.m. on June 18, 2011, she had progressed to 6 cm 80% vertex, at a -1 to 0 station. At 5:27 a.m., a living female infant was delivered vaginally. Presentation was OA.  Apgars were 9 and 9 at one and five minutes. Right labial laceration was sutured with 3-0 Vicryl.  Blood loss about 500 mL.  Baby was stable and was sent to the regular nursery.  On the 1st postpartum day, the patient requested early discharge.  She was afebrile.  Blood pressures were normal.  Her initial hemoglobin was 11.3, hematocrit 33.1, white count 16,300, platelet count 190,000.  RPR was nonreactive.  Followup hemoglobin 11.5, hematocrit 34.0.  At this point, the baby has not been seen by the  pediatrician in the morning. The patient requested discharge, so I am discharging the patient, but it is possible that if the baby cannot go, the patient will need to be a baby patient.  She is given our discharge instruction booklet, copy of our after visit summary, and she is advised to return to the office in 6 weeks for followup examination with Dr. Ellyn Hack.  Motrin 600 mg, 30 tablets, 1 every 6 hours as needed for pain and Percocet 5/325 one tablet every 4 hours as needed for pain are given at discharge.  The patient is to resume her prenatal vitamins.  She is asked to use Afrin as infrequently as possible.     Malachi Pro. Stacy [name], M.D.     TFH/MEDQ  D:  06/19/2011  T:  06/19/2011  Job:  [id number]

## 2012-03-18 ENCOUNTER — Encounter (HOSPITAL_COMMUNITY): Payer: Self-pay | Admitting: *Deleted

## 2012-03-18 ENCOUNTER — Emergency Department (HOSPITAL_COMMUNITY)
Admission: EM | Admit: 2012-03-18 | Discharge: 2012-03-19 | Disposition: A | Discharge status: Home / Self Care | Payer: Medicaid Other | Attending: Emergency Medicine | Admitting: Emergency Medicine

## 2012-03-18 DIAGNOSIS — F17200 Nicotine dependence, unspecified, uncomplicated: Secondary | ICD-10-CM | POA: Insufficient documentation

## 2012-03-18 DIAGNOSIS — Z3202 Encounter for pregnancy test, result negative: Secondary | ICD-10-CM | POA: Insufficient documentation

## 2012-03-18 DIAGNOSIS — N390 Urinary tract infection, site not specified: Secondary | ICD-10-CM | POA: Insufficient documentation

## 2012-03-18 DIAGNOSIS — R197 Diarrhea, unspecified: Secondary | ICD-10-CM | POA: Insufficient documentation

## 2012-03-18 DIAGNOSIS — Z8739 Personal history of other diseases of the musculoskeletal system and connective tissue: Secondary | ICD-10-CM | POA: Insufficient documentation

## 2012-03-18 DIAGNOSIS — R112 Nausea with vomiting, unspecified: Secondary | ICD-10-CM

## 2012-03-18 DIAGNOSIS — Z7982 Long term (current) use of aspirin: Secondary | ICD-10-CM | POA: Insufficient documentation

## 2012-03-18 LAB — COMPREHENSIVE METABOLIC PANEL
ALT: 15 U/L (ref 0–35)
Albumin: 4.7 g/dL (ref 3.5–5.2)
Alkaline Phosphatase: 138 U/L — ABNORMAL HIGH (ref 39–117)
Calcium: 9.1 mg/dL (ref 8.4–10.5)
GFR calc Af Amer: >90 mL/min (ref >90)
Glucose, Bld: 76 mg/dL (ref 70–99)
Potassium: 3.2 mEq/L — ABNORMAL LOW (ref 3.5–5.1)
Sodium: 137 mEq/L (ref 135–145)
Total Protein: 7.7 g/dL (ref 6.0–8.3)

## 2012-03-18 LAB — CBC WITH DIFFERENTIAL/PLATELET
Basophils Absolute: 0 10*3/uL (ref 0.0–0.1)
Basophils Relative: 0 % (ref 0–1)
Eosinophils Absolute: 0 10*3/uL (ref 0.0–0.7)
Eosinophils Relative: 0 % (ref 0–5)
Lymphs Abs: 0.7 10*3/uL (ref 0.7–4.0)
MCH: 31.5 pg (ref 26.0–34.0)
MCHC: 35.8 g/dL (ref 30.0–36.0)
MCV: 88.2 fL (ref 78.0–100.0)
Neutrophils Relative %: 90 % — ABNORMAL HIGH (ref 43–77)
Platelets: 180 10*3/uL (ref 150–400)
RBC: 4.82 MIL/uL (ref 3.87–5.11)
RDW: 13 % (ref 11.5–15.5)

## 2012-03-18 LAB — URINALYSIS, ROUTINE W REFLEX MICROSCOPIC
Ketones, ur: >80 mg/dL — AB
Nitrite: NEGATIVE
Protein, ur: 30 mg/dL — AB
Urobilinogen, UA: 1 mg/dL (ref 0.0–1.0)
pH: 5.5 (ref 5.0–8.0)

## 2012-03-18 LAB — URINE MICROSCOPIC-ADD ON

## 2012-03-18 MED ORDER — SODIUM CHLORIDE 0.9 % IV SOLN
Freq: Once | INTRAVENOUS | Status: AC
  Administered 2012-03-18: 23:00:00 via INTRAVENOUS

## 2012-03-18 NOTE — ED Notes (Signed)
The pt has had flank pain  With nv .  Not feeling very well since this started.  lmp one year.  Breast feeding

## 2012-03-19 MED ORDER — SODIUM CHLORIDE 0.9 % IV SOLN
Freq: Once | INTRAVENOUS | Status: AC
  Administered 2012-03-19: 01:00:00 via INTRAVENOUS

## 2012-03-19 MED ORDER — DEXTROSE 5 % IV SOLN
1.0000 g | Freq: Once | INTRAVENOUS | Status: AC
  Administered 2012-03-19: 1 g via INTRAVENOUS
  Filled 2012-03-19: qty 10

## 2012-03-19 MED ORDER — IBUPROFEN 400 MG PO TABS
600.0000 mg | ORAL_TABLET | Freq: Once | ORAL | Status: AC
  Administered 2012-03-19: 600 mg via ORAL
  Filled 2012-03-19: qty 2

## 2012-03-19 MED ORDER — ONDANSETRON HCL 4 MG/2ML IJ SOLN
4.0000 mg | Freq: Once | INTRAMUSCULAR | Status: AC
  Administered 2012-03-19: 4 mg via INTRAVENOUS
  Filled 2012-03-19: qty 2

## 2012-03-19 MED ORDER — IBUPROFEN 600 MG PO TABS: 600.0000 mg | ORAL_TABLET | Freq: Four times a day (QID) | ORAL | Status: DC | PRN

## 2012-03-19 MED ORDER — ONDANSETRON 8 MG PO TBDP: ORAL_TABLET | ORAL | Status: DC

## 2012-03-19 MED ORDER — SULFAMETHOXAZOLE-TRIMETHOPRIM 800-160 MG PO TABS: 1.0000 | ORAL_TABLET | Freq: Two times a day (BID) | ORAL | Status: DC

## 2012-03-19 NOTE — ED Provider Notes (Signed)
History     CSN: [id number]  Arrival date & time 03/18/12  2157   First MD Initiated Contact with Patient 03/18/12 2356      Chief Complaint  Patient presents with  . Flank Pain    (Consider location/radiation/quality/duration/timing/severity/associated sxs/prior treatment) Patient is a 32 y.o. female presenting with vomiting. The history is provided by the patient. No language interpreter was used.  Emesis  This is a new problem. The current episode started yesterday. The problem occurs 2 to 4 times per day. The problem has not changed since onset.The emesis has an appearance of stomach contents. There has been no fever. Associated symptoms include diarrhea. Pertinent negatives include no headaches.  Patient also has low back pain points to below posterior iliac crests and thinks she has an infection because she has had this before.  States she has not had any vaginal discharge or bleeding  Past Medical History  Diagnosis Date  . Supervision of normal pregnancy 11/02/2010  . Scoliosis     mild  . No pertinent past medical history     Past Surgical History  Procedure Date  . Cholecystectomy 7 years ago  . Ectopic pregnancy surgery 2-3 years ago    just tube removal  . Salpingectomy 2008    right  . No past surgeries     Family History  Problem Relation Age of Onset  . Diabetes Father   . Depression Father     History  Substance Use Topics  . Smoking status: Current Every Day Smoker -- 0.5 packs/day    Types: Cigarettes  . Smokeless tobacco: Never Used  . Alcohol Use: No    OB History    Grav Para Term Preterm Abortions TAB SAB Ect Mult Living   4 3 3  0 1 0 0 1 0 3      Review of Systems  Gastrointestinal: Positive for nausea, vomiting and diarrhea.  Neurological: Negative for headaches.  All other systems reviewed and are negative.    Allergies  Review of patient's allergies indicates no known allergies.  Home Medications   Current Outpatient Rx    Name  Route  Sig  Dispense  Refill  . ASPIRIN-ACETAMINOPHEN-CAFFEINE 250-250-65 MG PO TABS   Oral   Take 1 tablet by mouth daily as needed. For headache         . IBUPROFEN 200 MG PO TABS   Oral   Take 200-400 mg by mouth every 6 (six) hours as needed. For pain         . OXYMETAZOLINE HCL 0.05 % NA SOLN   Nasal   Place 2 sprays into the nose 2 (two) times daily as needed. For stuffiness           BP 95/67  Pulse 125  Temp 98 F (36.7 C) (Oral)  Resp 16  SpO2 99%  Physical Exam  Constitutional: She is oriented to person, place, and time. She appears well-developed and well-nourished. No distress.  HENT:  Head: Normocephalic and atraumatic.  Mouth/Throat: Oropharynx is clear and moist.  Eyes: Conjunctivae normal are normal. Pupils are equal, round, and reactive to light.  Neck: Normal range of motion.  Cardiovascular: Normal rate, regular rhythm and intact distal pulses.   Pulmonary/Chest: Effort normal and breath sounds normal. She has no wheezes. She has no rales.  Abdominal: Soft. Bowel sounds are normal. There is no tenderness. There is no rebound and no guarding.       No CVA tenderness  Musculoskeletal: Normal range of motion.  Neurological: She is alert and oriented to person, place, and time.  Skin: Skin is warm and dry.  Psychiatric: She has a normal mood and affect.    ED Course  Procedures (including critical care time)  Labs Reviewed  URINALYSIS, ROUTINE W REFLEX MICROSCOPIC - Abnormal; Notable for the following:    APPearance TURBID (*)     Specific Gravity, Urine 1.040 (*)     Bilirubin Urine SMALL (*)     Ketones, ur >80 (*)     Protein, ur 30 (*)     Leukocytes, UA MODERATE (*)     All other components within normal limits  CBC WITH DIFFERENTIAL - Abnormal; Notable for the following:    WBC 11.5 (*)     Hemoglobin 15.2 (*)     Neutrophils Relative 90 (*)     Neutro Abs 10.4 (*)     Lymphocytes Relative 6 (*)     All other components  within normal limits  COMPREHENSIVE METABOLIC PANEL - Abnormal; Notable for the following:    Potassium 3.2 (*)     Alkaline Phosphatase 138 (*)     All other components within normal limits  URINE MICROSCOPIC-ADD ON - Abnormal; Notable for the following:    Squamous Epithelial / LPF MANY (*)     Bacteria, UA MANY (*)     All other components within normal limits  PREGNANCY, URINE  URINE CULTURE   No results found.   No diagnosis found.    MDM  No CVA tenderness.  No fever.  Suspect n/v/d cw with viral gastroenteritis with superimposed UTI.  Will treat UTI.  Patient informed must contact Pediatrician if breast feeding of instructions.  Verbalizes understanding and agrees to follow up       Sheamus Hasting Smitty Cords, MD 03/19/12 6213

## 2012-03-19 NOTE — ED Notes (Signed)
Spoke with Tammy Sours in Pharmacy to verify Zofran administration due to override warning for lactating pt.  Medication verified.

## 2012-03-19 NOTE — ED Notes (Signed)
Pt ate crackers/graham crackers without any difficulties.

## 2012-03-20 LAB — URINE CULTURE: Colony Count: 45000

## 2013-01-28 ENCOUNTER — Emergency Department (HOSPITAL_COMMUNITY)
Admission: EM | Admit: 2013-01-28 | Discharge: 2013-01-28 | Disposition: A | Discharge status: Home / Self Care | Payer: Medicaid Other | Attending: Emergency Medicine | Admitting: Emergency Medicine

## 2013-01-28 ENCOUNTER — Encounter (HOSPITAL_COMMUNITY): Payer: Self-pay | Admitting: Emergency Medicine

## 2013-01-28 DIAGNOSIS — Z3202 Encounter for pregnancy test, result negative: Secondary | ICD-10-CM | POA: Insufficient documentation

## 2013-01-28 DIAGNOSIS — Z79899 Other long term (current) drug therapy: Secondary | ICD-10-CM | POA: Insufficient documentation

## 2013-01-28 DIAGNOSIS — M549 Dorsalgia, unspecified: Secondary | ICD-10-CM | POA: Insufficient documentation

## 2013-01-28 DIAGNOSIS — F17200 Nicotine dependence, unspecified, uncomplicated: Secondary | ICD-10-CM | POA: Insufficient documentation

## 2013-01-28 LAB — URINALYSIS, ROUTINE W REFLEX MICROSCOPIC
Bilirubin Urine: NEGATIVE
Glucose, UA: NEGATIVE mg/dL
Hgb urine dipstick: NEGATIVE
Protein, ur: NEGATIVE mg/dL
Specific Gravity, Urine: 1.034 — ABNORMAL HIGH (ref 1.005–1.030)

## 2013-01-28 LAB — POCT PREGNANCY, URINE: Preg Test, Ur: NEGATIVE

## 2013-01-28 MED ORDER — IBUPROFEN 400 MG PO TABS
600.0000 mg | ORAL_TABLET | Freq: Once | ORAL | Status: AC
  Administered 2013-01-28: 600 mg via ORAL
  Filled 2013-01-28 (×2): qty 1

## 2013-01-28 MED ORDER — IBUPROFEN 600 MG PO TABS: 600.0000 mg | ORAL_TABLET | Freq: Four times a day (QID) | ORAL | Status: DC | PRN

## 2013-01-28 MED ORDER — OXYCODONE-ACETAMINOPHEN 5-325 MG PO TABS: 1.0000 | ORAL_TABLET | ORAL | Status: DC | PRN

## 2013-01-28 NOTE — ED Notes (Signed)
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

## 2013-01-28 NOTE — ED Provider Notes (Signed)
CSN: [id number]     Arrival date & time 01/28/13  4098 History   First MD Initiated Contact with Patient 01/28/13 0349     Chief Complaint  Patient presents with  . Back Pain    shooting down right leg    Patient is a 32 y.o. female presenting with back pain. The history is provided by the patient.  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 Supervision of normal pregnancy 11/02/2010  . Scoliosis     mild  . No pertinent past medical history    Past Surgical History  Procedure Laterality Date  . Cholecystectomy  7 years ago  . Ectopic pregnancy surgery  2-3 years ago    just tube removal  . Salpingectomy  2008    right  . No past surgeries     Family History  Problem Relation Age of Onset  . Diabetes Father   . Depression Father    History  Substance Use Topics  . Smoking status: Current Every Day Smoker -- 0.50 packs/day    Types: Cigarettes  . Smokeless tobacco: Never Used  . Alcohol Use: No   OB History   Grav Para Term Preterm Abortions TAB SAB Ect Mult Living   4 3 3  0 1 0 0 1 0 3     Review of Systems  Constitutional: Negative for fever.  Gastrointestinal: Negative for vomiting, abdominal pain and bowel incontinence.  Genitourinary: Negative for bladder incontinence and dysuria.  Musculoskeletal: Positive for back pain.  Neurological: Negative for weakness.    Allergies  Review of patient's allergies indicates no known allergies.  Home Medications    Current Outpatient Rx  Name  Route  Sig  Dispense  Refill  . aspirin-acetaminophen-caffeine (EXCEDRIN MIGRAINE) 250-250-65 MG per tablet   Oral   Take 1 tablet by mouth daily as needed. For headache         . ibuprofen (ADVIL,MOTRIN) 200 MG tablet   Oral   Take 200-400 mg by mouth every 6 (six) hours as needed. For pain         . ibuprofen (ADVIL,MOTRIN) 600 MG tablet   Oral   Take 1 tablet (600 mg total) by mouth every 6 (six) hours as needed for pain.   30 tablet   0   . ondansetron (ZOFRAN ODT) 8 MG disintegrating tablet      8mg  ODT q4 hours prn nausea   4 tablet   0   . oxymetazoline (AFRIN) 0.05 % nasal spray   Nasal   Place 2 sprays into the nose 2 (two) times daily as needed. For stuffiness         . sulfamethoxazole-trimethoprim (SEPTRA DS) 800-160 MG per tablet   Oral   Take 1 tablet by mouth every 12 (twelve) hours.   10 tablet   0    BP 119/69  Pulse 90  Temp(Src) 97.9 F (36.6 C) (Oral)  Ht 5\' 8"  (1.727 m)  Wt 130 lb (  58.968 kg)  BMI 19.77 kg/m2  SpO2 100%  LMP 01/10/2013  Breastfeeding? Yes Physical Exam CONSTITUTIONAL: Well developed/well nourished HEAD: Normocephalic/atraumatic EYES: EOMI/PERRL ENMT: Mucous membranes moist NECK: supple no meningeal signs SPINE:entire spine nontender , tenderness over right SI joint, No bruising/crepitance/stepoffs noted to spine CV: S1/S2 noted, no murmurs/rubs/gallops noted LUNGS: Lungs are clear to auscultation bilaterally, no apparent distress ABDOMEN: soft, nontender, no rebound or guarding GU:no cva tenderness NEURO: Awake/alert,equal distal motor: hip flexion/knee flexion/extension, ankle dorsi/plantar flexion, great toe extension intact bilaterally, no clonus bilaterally, plantar reflex appropriate, no apparent sensory deficit in any dermatome.  Equal patellar/achilles reflex noted.  Pt is able to ambulate. EXTREMITIES: pulses normal, full ROM SKIN: warm, color normal PSYCH: no abnormalities of  mood noted   ED Course  Procedures (including critical care time) Labs Review Labs Reviewed  URINALYSIS, ROUTINE W REFLEX MICROSCOPIC   Imaging Review No results found.  EKG Interpretation   None       MDM  No diagnosis found. Nursing notes including past medical history and social history reviewed and considered in documentation Labs/vital reviewed and considered   4:04 AM Suspect MSK pain She has no neuro deficits She is currently breastfeeding and she understands that meds given to her will also cross into breast milk.  She will take Rx for percocet in case she needs it at home.  Ibuprofen given in the ED    Joya Gaskins, MD 01/28/13 [phone]

## 2013-01-28 NOTE — ED Notes (Signed)
Right sided back pain that shoots down right leg x 2 days, no recent injury or strain that pt is aware of

## 2013-02-23 NOTE — L&D Delivery Note (Signed)
Delivery Note At 8:22 PM a viable and healthy female was delivered via Vaginal, Spontaneous Delivery (Presentation: Left Occiput Anterior).  APGAR: 9, 10; weight P.   Placenta status: Intact, Spontaneous.  Cord: 3 vessels with the following complications: None.    Anesthesia: Epidural  Episiotomy: None Lacerations: 1st degree;Labial Suture Repair: 3.0 vicryl rapide Est. Blood Loss (mL): 400  Mom to postpartum.  Baby to Couplet care / Skin to Skin.  Bovard-Stuckert, Stacy [name] 12/05/2013, 9:26 PM  Br/Bo; O+; [place]?

## 2013-05-19 LAB — OB RESULTS CONSOLE HIV ANTIBODY (ROUTINE TESTING): HIV: NONREACTIVE

## 2013-05-19 LAB — OB RESULTS CONSOLE ABO/RH: RH Type: POSITIVE

## 2013-05-19 LAB — OB RESULTS CONSOLE HEPATITIS B SURFACE ANTIGEN: HEP B S AG: NEGATIVE

## 2013-05-19 LAB — OB RESULTS CONSOLE GC/CHLAMYDIA
Chlamydia: NEGATIVE
GC PROBE AMP, GENITAL: NEGATIVE

## 2013-05-19 LAB — OB RESULTS CONSOLE ANTIBODY SCREEN: ANTIBODY SCREEN: NEGATIVE

## 2013-05-19 LAB — OB RESULTS CONSOLE RUBELLA ANTIBODY, IGM: RUBELLA: IMMUNE

## 2013-05-19 LAB — OB RESULTS CONSOLE RPR: RPR: NONREACTIVE

## 2013-09-10 ENCOUNTER — Inpatient Hospital Stay (HOSPITAL_COMMUNITY)
Admission: AD | Admit: 2013-09-10 | Discharge: 2013-09-10 | Disposition: A | Discharge status: Home / Self Care | Payer: Medicaid Other | Source: Ambulatory Visit | Attending: Obstetrics and Gynecology | Admitting: Obstetrics and Gynecology

## 2013-09-10 ENCOUNTER — Encounter (HOSPITAL_COMMUNITY): Payer: Self-pay | Admitting: *Deleted

## 2013-09-10 DIAGNOSIS — M4120 Other idiopathic scoliosis, site unspecified: Secondary | ICD-10-CM | POA: Insufficient documentation

## 2013-09-10 DIAGNOSIS — O99330 Smoking (tobacco) complicating pregnancy, unspecified trimester: Secondary | ICD-10-CM | POA: Insufficient documentation

## 2013-09-10 DIAGNOSIS — O9989 Other specified diseases and conditions complicating pregnancy, childbirth and the puerperium: Principal | ICD-10-CM

## 2013-09-10 DIAGNOSIS — O99891 Other specified diseases and conditions complicating pregnancy: Secondary | ICD-10-CM | POA: Insufficient documentation

## 2013-09-10 DIAGNOSIS — R05 Cough: Secondary | ICD-10-CM | POA: Diagnosis present

## 2013-09-10 DIAGNOSIS — R059 Cough, unspecified: Secondary | ICD-10-CM | POA: Diagnosis present

## 2013-09-10 MED ORDER — PHENYLEPH-PROMETHAZINE-COD 5-6.25-10 MG/5ML PO SYRP: 5.0000 mL | ORAL_SOLUTION | Freq: Four times a day (QID) | ORAL | Status: DC

## 2013-09-10 MED ORDER — AZITHROMYCIN 250 MG PO TABS: ORAL_TABLET | ORAL | Status: DC

## 2013-09-10 NOTE — MAU Provider Note (Signed)
History     CSN: [id number]  Arrival date and time: 09/10/13 1523   First Provider Initiated Contact with Patient 09/10/13 1544      Chief Complaint  Patient presents with  . Cough   HPI Stacy [name] 33 y.o. [redacted]w[redacted]d  Comes to MAU with a cough today.  Has been sick with a cold and cough for 2 weeks.  Coughing continues and sometimes she coughs so much she cannot get her breath.  Is worried and thinks she may have pneumonia.  Denies having fever.  Coughs less when she is resting and more when up moving around.  Has 3 children at home.  Has not taken any medications no tylenol, no claritin.  No headaches when bending over.  No soreness in her neck.  OB History   Grav Para Term Preterm Abortions TAB SAB Ect Mult Living   5 3 3  0 1 0 0 1 0 3      Past Medical History  Diagnosis Date  . Supervision of normal pregnancy 11/02/2010  . Scoliosis     mild  . No pertinent past medical history     Past Surgical History  Procedure Laterality Date  . Cholecystectomy  7 years ago  . Ectopic pregnancy surgery  2-3 years ago    just tube removal  . Salpingectomy  2008    right  . No past surgeries      Family History  Problem Relation Age of Onset  . Diabetes Father   . Depression Father     History  Substance Use Topics  . Smoking status: Current Every Day Smoker -- 0.50 packs/day    Types: Cigarettes  . Smokeless tobacco: Never Used  . Alcohol Use: No    Allergies: No Known Allergies  Prescriptions prior to admission  Medication Sig Dispense Refill  . ibuprofen (ADVIL,MOTRIN) 600 MG tablet Take 1 tablet (600 mg total) by mouth every 6 (six) hours as needed.  15 tablet  0  . oxyCODONE-acetaminophen (PERCOCET/ROXICET) 5-325 MG per tablet Take 1 tablet by mouth every 4 (four) hours as needed for severe pain.  5 tablet  0    Review of Systems  Constitutional: Negative for fever and chills.  HENT: Positive for congestion. Negative for sore throat.   Respiratory: Positive  for cough and shortness of breath. Negative for hemoptysis and wheezing.   Neurological: Negative for headaches.   Physical Exam   Blood pressure 124/70, pulse 101, temperature 98.2 F (36.8 C), temperature source Oral, resp. rate 18, last menstrual period 03/07/2013, SpO2 98.00%, currently breastfeeding.  Physical Exam  Nursing note and vitals reviewed. Constitutional: She is oriented to person, place, and time. She appears well-developed and well-nourished.  HENT:  Head: Normocephalic.  Eyes: EOM are normal.  Neck: Neck supple.  Cardiovascular: Normal heart sounds.   Respiratory: Breath sounds normal. No respiratory distress. She has no wheezes. She has no rales.  Pulse Ox - 98% Has congested cough but lungs are clear.  GI: Soft. There is no tenderness.  Musculoskeletal: Normal range of motion.  Neurological: She is alert and oriented to person, place, and time.  Skin: Skin is warm and dry.  Psychiatric: She has a normal mood and affect.    MAU Course  Procedures  MDM 1640  Consult with Dr. Ellyn Hack.  Assessment and Plan  Cough x 2 weeks  Plan Drink at least 8 8-oz glasses of water every day. Take Tylenol 325 mg 2 tablets by mouth  every 4 hours if needed for pain. Will prescribe zpack and cough syrup Can also take Zyrtec OTC.   Follow up in the office.  BURLESON,TERRI 09/10/2013, 3:50 PM

## 2013-09-10 NOTE — MAU Note (Signed)
Pt. States she has had a cough for two weeks and recently it became worse. Is producing a very small amount of mucous that is clear to yellowish. Pt. States she is having a hard time breathing. Denies fever or chills. Daughter has a cough as well. Took a BC powder yesterday. Baby has been moving well.

## 2013-09-10 NOTE — MAU Provider Note (Signed)
NST - REACTIVE FOR GESTATIONAL AGE, 130-140 GOOD VARIABILITY CATEGORY 1

## 2013-09-10 NOTE — Discharge Instructions (Signed)
Drink at least 8 8-oz glasses of water every day. Take Tylenol 325 mg 2 tablets by mouth every 4 hours if needed for pain. Can also take Zyrtec OTC and an over the counter plain robitussin (the prescription cough medicine will make you sleepy). Get your prescriptions filled. Follow up in the office] if you are not improving.

## 2013-09-10 NOTE — MAU Note (Signed)
Pt presents to MAU with complaints of having a cough for about two weeks and she feels like she can't catch her breath.

## 2013-11-08 LAB — OB RESULTS CONSOLE GBS: GBS: POSITIVE

## 2013-11-25 ENCOUNTER — Inpatient Hospital Stay (HOSPITAL_COMMUNITY)
Admission: AD | Admit: 2013-11-25 | Discharge: 2013-11-25 | Disposition: A | Discharge status: Home / Self Care | Payer: Medicaid Other | Source: Ambulatory Visit | Attending: Obstetrics and Gynecology | Admitting: Obstetrics and Gynecology

## 2013-11-25 ENCOUNTER — Encounter (HOSPITAL_COMMUNITY): Payer: Self-pay

## 2013-11-25 DIAGNOSIS — Z3A37 37 weeks gestation of pregnancy: Secondary | ICD-10-CM | POA: Insufficient documentation

## 2013-11-25 NOTE — Discharge Instructions (Signed)
Third Trimester of Pregnancy °The third trimester is from week 29 through week 42, months 7 through 9. The third trimester is a time when the fetus is growing rapidly. At the end of the ninth month, the fetus is about 20 inches in length and weighs 6-10 pounds.  °BODY CHANGES °Your body goes through many changes during pregnancy. The changes vary from woman to woman.  °· Your weight will continue to increase. You can expect to gain 25-35 pounds (11-16 kg) by the end of the pregnancy. °· You may begin to get stretch marks on your hips, abdomen, and breasts. °· You may urinate more often because the fetus is moving lower into your pelvis and pressing on your bladder. °· You may develop or continue to have heartburn as a result of your pregnancy. °· You may develop constipation because certain hormones are causing the muscles that push waste through your intestines to slow down. °· You may develop hemorrhoids or swollen, bulging veins (varicose veins). °· You may have pelvic pain because of the weight gain and pregnancy hormones relaxing your joints between the bones in your pelvis. Backaches may result from overexertion of the muscles supporting your posture. °· You may have changes in your hair. These can include thickening of your hair, rapid growth, and changes in texture. Some women also have hair loss during or after pregnancy, or hair that feels dry or thin. Your hair will most likely return to normal after your baby is born. °· Your breasts will continue to grow and be tender. A yellow discharge may leak from your breasts called colostrum. °· Your belly button may stick out. °· You may feel short of breath because of your expanding uterus. °· You may notice the fetus "dropping," or moving lower in your abdomen. °· You may have a bloody mucus discharge. This usually occurs a few days to a week before labor begins. °· Your cervix becomes thin and soft (effaced) near your due date. °WHAT TO EXPECT AT YOUR PRENATAL  EXAMS  °You will have prenatal exams every 2 weeks until week 36. Then, you will have weekly prenatal exams. During a routine prenatal visit: °· You will be weighed to make sure you and the fetus are growing normally. °· Your blood pressure is taken. °· Your abdomen will be measured to track your baby's growth. °· The fetal heartbeat will be listened to. °· Any test results from the previous visit will be discussed. °· You may have a cervical check near your due date to see if you have effaced. °At around 36 weeks, your caregiver will check your cervix. At the same time, your caregiver will also perform a test on the secretions of the vaginal tissue. This test is to determine if a type of bacteria, Group B streptococcus, is present. Your caregiver will explain this further. °Your caregiver may ask you: °· What your birth plan is. °· How you are feeling. °· If you are feeling the baby move. °· If you have had any abnormal symptoms, such as leaking fluid, bleeding, severe headaches, or abdominal cramping. °· If you have any questions. °Other tests or screenings that may be performed during your third trimester include: °· Blood tests that check for low iron levels (anemia). °· Fetal testing to check the health, activity level, and growth of the fetus. Testing is done if you have certain medical conditions or if there are problems during the pregnancy. °FALSE LABOR °You may feel small, irregular contractions that   eventually go away. These are called Braxton Hicks contractions, or false labor. Contractions may last for hours, days, or even weeks before true labor sets in. If contractions come at regular intervals, intensify, or become painful, it is best to be seen by your caregiver.  °SIGNS OF LABOR  °· Menstrual-like cramps. °· Contractions that are 5 minutes apart or less. °· Contractions that start on the top of the uterus and spread down to the lower abdomen and back. °· A sense of increased pelvic pressure or back  pain. °· A watery or bloody mucus discharge that comes from the vagina. °If you have any of these signs before the 37th week of pregnancy, call your caregiver right away. You need to go to the hospital to get checked immediately. °HOME CARE INSTRUCTIONS  °· Avoid all smoking, herbs, alcohol, and unprescribed drugs. These chemicals affect the formation and growth of the baby. °· Follow your caregiver's instructions regarding medicine use. There are medicines that are either safe or unsafe to take during pregnancy. °· Exercise only as directed by your caregiver. Experiencing uterine cramps is a good sign to stop exercising. °· Continue to eat regular, healthy meals. °· Wear a good support bra for breast tenderness. °· Do not use hot tubs, steam rooms, or saunas. °· Wear your seat belt at all times when driving. °· Avoid raw meat, uncooked cheese, cat litter boxes, and soil used by cats. These carry germs that can cause birth defects in the baby. °· Take your prenatal vitamins. °· Try taking a stool softener (if your caregiver approves) if you develop constipation. Eat more high-fiber foods, such as fresh vegetables or fruit and whole grains. Drink plenty of fluids to keep your urine clear or pale yellow. °· Take warm sitz baths to soothe any pain or discomfort caused by hemorrhoids. Use hemorrhoid cream if your caregiver approves. °· If you develop varicose veins, wear support hose. Elevate your feet for 15 minutes, 3-4 times a day. Limit salt in your diet. °· Avoid heavy lifting, wear low heal shoes, and practice good posture. °· Rest a lot with your legs elevated if you have leg cramps or low back pain. °· Visit your dentist if you have not gone during your pregnancy. Use a soft toothbrush to brush your teeth and be gentle when you floss. °· A sexual relationship may be continued unless your caregiver directs you otherwise. °· Do not travel far distances unless it is absolutely necessary and only with the approval  of your caregiver. °· Take prenatal classes to understand, practice, and ask questions about the labor and delivery. °· Make a trial run to the hospital. °· Pack your hospital bag. °· Prepare the baby's nursery. °· Continue to go to all your prenatal visits as directed by your caregiver. °SEEK MEDICAL CARE IF: °· You are unsure if you are in labor or if your water has broken. °· You have dizziness. °· You have mild pelvic cramps, pelvic pressure, or nagging pain in your abdominal area. °· You have persistent nausea, vomiting, or diarrhea. °· You have a bad smelling vaginal discharge. °· You have pain with urination. °SEEK IMMEDIATE MEDICAL CARE IF:  °· You have a fever. °· You are leaking fluid from your vagina. °· You have spotting or bleeding from your vagina. °· You have severe abdominal cramping or pain. °· You have rapid weight loss or gain. °· You have shortness of breath with chest pain. °· You notice sudden or extreme swelling   of your face, hands, ankles, feet, or legs. °· You have not felt your baby move in over an hour. °· You have severe headaches that do not go away with medicine. °· You have vision changes. °Document Released: 02/03/2001 Document Revised: 02/14/2013 Document Reviewed: 04/12/2012 °[employer]® Patient Information ©2015 [employer]. This information is not intended to replace advice given to you by your health care provider. Make sure you discuss any questions you have with your health care provider. °Fetal Movement Counts °Patient Name: __________________________________________________ Patient Due Date: ____________________ °Performing a fetal movement count is highly recommended in high-risk pregnancies, but it is good for every pregnant woman to do. Your health care provider may ask you to start counting fetal movements at 28 weeks of the pregnancy. Fetal movements often increase: °· After eating a full meal. °· After physical activity. °· After eating or drinking something sweet or  cold. °· At rest. °Pay attention to when you feel the baby is most active. This will help you notice a pattern of your baby's sleep and wake cycles and what factors contribute to an increase in fetal movement. It is important to perform a fetal movement count at the same time each day when your baby is normally most active.  °HOW TO COUNT FETAL MOVEMENTS °1. Find a quiet and comfortable area to sit or lie down on your left side. Lying on your left side provides the best blood and oxygen circulation to your baby. °2. Write down the day and time on a sheet of paper or in a journal. °3. Start counting kicks, flutters, swishes, rolls, or jabs in a 2-hour period. You should feel at least 10 movements within 2 hours. °4. If you do not feel 10 movements in 2 hours, wait 2-3 hours and count again. Look for a change in the pattern or not enough counts in 2 hours. °SEEK MEDICAL CARE IF: °· You feel less than 10 counts in 2 hours, tried twice. °· There is no movement in over an hour. °· The pattern is changing or taking longer each day to reach 10 counts in 2 hours. °· You feel the baby is not moving as he or she usually does. °Date: ____________ Movements: ____________ Start time: ____________ Finish time: ____________  °Date: ____________ Movements: ____________ Start time: ____________ Finish time: ____________ °Date: ____________ Movements: ____________ Start time: ____________ Finish time: ____________ °Date: ____________ Movements: ____________ Start time: ____________ Finish time: ____________ °Date: ____________ Movements: ____________ Start time: ____________ Finish time: ____________ °Date: ____________ Movements: ____________ Start time: ____________ Finish time: ____________ °Date: ____________ Movements: ____________ Start time: ____________ Finish time: ____________ °Date: ____________ Movements: ____________ Start time: ____________ Finish time: ____________  °Date: ____________ Movements: ____________ Start  time: ____________ Finish time: ____________ °Date: ____________ Movements: ____________ Start time: ____________ Finish time: ____________ °Date: ____________ Movements: ____________ Start time: ____________ Finish time: ____________ °Date: ____________ Movements: ____________ Start time: ____________ Finish time: ____________ °Date: ____________ Movements: ____________ Start time: ____________ Finish time: ____________ °Date: ____________ Movements: ____________ Start time: ____________ Finish time: ____________ °Date: ____________ Movements: ____________ Start time: ____________ Finish time: ____________  °Date: ____________ Movements: ____________ Start time: ____________ Finish time: ____________ °Date: ____________ Movements: ____________ Start time: ____________ Finish time: ____________ °Date: ____________ Movements: ____________ Start time: ____________ Finish time: ____________ °Date: ____________ Movements: ____________ Start time: ____________ Finish time: ____________ °Date: ____________ Movements: ____________ Start time: ____________ Finish time: ____________ °Date: ____________ Movements: ____________ Start time: ____________ Finish time: ____________ °Date: ____________ Movements: ____________ Start time: ____________ Finish time:   ____________  Date: ____________ Movements: ____________ Start time: ____________ Doreatha Martin time: ____________ Date: ____________ Movements: ____________ Start time: ____________ Doreatha Martin time: ____________ Date: ____________ Movements: ____________ Start time: ____________ Doreatha Martin time: ____________ Date: ____________ Movements: ____________ Start time: ____________ Doreatha Martin time: ____________ Date: ____________ Movements: ____________ Start time: ____________ Doreatha Martin time: ____________ Date: ____________ Movements: ____________ Start time: ____________ Doreatha Martin time: ____________ Date: ____________ Movements: ____________ Start time: ____________ Doreatha Martin time: ____________    Date: ____________ Movements: ____________ Start time: ____________ Doreatha Martin time: ____________ Date: ____________ Movements: ____________ Start time: ____________ Doreatha Martin time: ____________ Date: ____________ Movements: ____________ Start time: ____________ Doreatha Martin time: ____________ Date: ____________ Movements: ____________ Start time: ____________ Doreatha Martin time: ____________ Date: ____________ Movements: ____________ Start time: ____________ Doreatha Martin time: ____________ Date: ____________ Movements: ____________ Start time: ____________ Doreatha Martin time: ____________ Date: ____________ Movements: ____________ Start time: ____________ Doreatha Martin time: ____________  Date: ____________ Movements: ____________ Start time: ____________ Doreatha Martin time: ____________ Date: ____________ Movements: ____________ Start time: ____________ Doreatha Martin time: ____________ Date: ____________ Movements: ____________ Start time: ____________ Doreatha Martin time: ____________ Date: ____________ Movements: ____________ Start time: ____________ Doreatha Martin time: ____________ Date: ____________ Movements: ____________ Start time: ____________ Doreatha Martin time: ____________ Date: ____________ Movements: ____________ Start time: ____________ Doreatha Martin time: ____________ Date: ____________ Movements: ____________ Start time: ____________ Doreatha Martin time: ____________  Date: ____________ Movements: ____________ Start time: ____________ Doreatha Martin time: ____________ Date: ____________ Movements: ____________ Start time: ____________ Doreatha Martin time: ____________ Date: ____________ Movements: ____________ Start time: ____________ Doreatha Martin time: ____________ Date: ____________ Movements: ____________ Start time: ____________ Doreatha Martin time: ____________ Date: ____________ Movements: ____________ Start time: ____________ Doreatha Martin time: ____________ Date: ____________ Movements: ____________ Start time: ____________ Doreatha Martin time: ____________ Date: ____________ Movements: ____________  Start time: ____________ Doreatha Martin time: ____________  Date: ____________ Movements: ____________ Start time: ____________ Doreatha Martin time: ____________ Date: ____________ Movements: ____________ Start time: ____________ Doreatha Martin time: ____________ Date: ____________ Movements: ____________ Start time: ____________ Doreatha Martin time: ____________ Date: ____________ Movements: ____________ Start time: ____________ Doreatha Martin time: ____________ Date: ____________ Movements: ____________ Start time: ____________ Doreatha Martin time: ____________ Date: ____________ Movements: ____________ Start time: ____________ Doreatha Martin time: ____________ Document Released: 03/11/2006 Document Revised: 06/26/2013 Document Reviewed: 12/07/2011 [employer] Patient Information 2015 [employer]. This information is not intended to replace advice given to you by your health care provider. Make sure you discuss any questions you have with your health care provider. Braxton Hicks Contractions Contractions of the uterus can occur throughout pregnancy. Contractions are not always a sign that you are in labor.  WHAT ARE BRAXTON HICKS CONTRACTIONS?  Contractions that occur before labor are called Braxton Hicks contractions, or false labor. Toward the end of pregnancy (32-34 weeks), these contractions can develop more often and may become more forceful. This is not true labor because these contractions do not result in opening (dilatation) and thinning of the cervix. They are sometimes difficult to tell apart from true labor because these contractions can be forceful and people have different pain tolerances. You should not feel embarrassed if you go to the hospital with false labor. Sometimes, the only way to tell if you are in true labor is for your health care provider to look for changes in the cervix. If there are no prenatal problems or other health problems associated with the pregnancy, it is completely safe to be sent home with false labor and await  the onset of true labor. HOW CAN YOU TELL THE DIFFERENCE BETWEEN TRUE AND FALSE LABOR? False Labor  The contractions of false labor are usually shorter and not as hard as those of true labor.   The  contractions are usually irregular.   °· The contractions are often felt in the front of the lower abdomen and in the groin.   °· The contractions may go away when you walk around or change positions while lying down.   °· The contractions get weaker and are shorter lasting as time goes on.   °· The contractions do not usually become progressively stronger, regular, and closer together as with true labor.   °True Labor °· Contractions in true labor last 30-70 seconds, become very regular, usually become more intense, and increase in frequency.   °· The contractions do not go away with walking.   °· The discomfort is usually felt in the top of the uterus and spreads to the lower abdomen and low back.   °· True labor can be determined by your health care provider with an exam. This will show that the cervix is dilating and getting thinner.   °WHAT TO REMEMBER °· Keep up with your usual exercises and follow other instructions given by your health care provider.   °· Take medicines as directed by your health care provider.   °· Keep your regular prenatal appointments.   °· Eat and drink lightly if you think you are going into labor.   °· If Braxton Hicks contractions are making you uncomfortable:   °¨ Change your position from lying down or resting to walking, or from walking to resting.   °¨ Sit and rest in a tub of warm water.   °¨ Drink 2-3 glasses of water. Dehydration may cause these contractions.   °¨ Do slow and deep breathing several times an hour.   °WHEN SHOULD I SEEK IMMEDIATE MEDICAL CARE? °Seek immediate medical care if: °· Your contractions become stronger, more regular, and closer together.   °· You have fluid leaking or gushing from your vagina.   °· You have a fever.   °· You pass blood-tinged mucus.    °· You have vaginal bleeding.   °· You have continuous abdominal pain.   °· You have low back pain that you never had before.   °· You feel your baby's head pushing down and causing pelvic pressure.   °· Your baby is not moving as much as it used to.   °Document Released: 02/09/2005 Document Revised: 02/14/2013 Document Reviewed: 11/21/2012 °[employer]® Patient Information ©2015 [employer]. This information is not intended to replace advice given to you by your health care provider. Make sure you discuss any questions you have with your health care provider. ° °

## 2013-11-25 NOTE — Progress Notes (Signed)
Notified of pt arrival in [facility], vaginal exam and uterine activity. Received orders to discharge home  

## 2013-11-27 ENCOUNTER — Encounter (HOSPITAL_COMMUNITY): Payer: Self-pay | Admitting: *Deleted

## 2013-11-27 ENCOUNTER — Inpatient Hospital Stay (HOSPITAL_COMMUNITY)
Admission: AD | Admit: 2013-11-27 | Discharge: 2013-11-27 | Disposition: A | Discharge status: Home / Self Care | Payer: Medicaid Other | Source: Ambulatory Visit | Attending: Obstetrics and Gynecology | Admitting: Obstetrics and Gynecology

## 2013-11-27 DIAGNOSIS — Z3A38 38 weeks gestation of pregnancy: Secondary | ICD-10-CM | POA: Diagnosis not present

## 2013-11-27 DIAGNOSIS — O471 False labor at or after 37 completed weeks of gestation: Secondary | ICD-10-CM | POA: Insufficient documentation

## 2013-11-27 NOTE — [facility] Note (Signed)
Pt may go home and return when contractions become stronger and more frequent, or SROM, decreased fetal movement. Discharge instructions reviewed with pt.

## 2013-11-27 NOTE — [facility] Note (Signed)
Pt reports contractions today. Denies problems with preg

## 2013-11-27 NOTE — Discharge Instructions (Signed)
Fetal Movement Counts °Patient Name: __________________________________________________ Patient Due Date: ____________________ °Performing a fetal movement count is highly recommended in high-risk pregnancies, but it is good for every pregnant woman to do. Your health care provider may ask you to start counting fetal movements at 28 weeks of the pregnancy. Fetal movements often increase: °· After eating a full meal. °· After physical activity. °· After eating or drinking something sweet or cold. °· At rest. °Pay attention to when you feel the baby is most active. This will help you notice a pattern of your baby's sleep and wake cycles and what factors contribute to an increase in fetal movement. It is important to perform a fetal movement count at the same time each day when your baby is normally most active.  °HOW TO COUNT FETAL MOVEMENTS °1. Find a quiet and comfortable area to sit or lie down on your left side. Lying on your left side provides the best blood and oxygen circulation to your baby. °2. Write down the day and time on a sheet of paper or in a journal. °3. Start counting kicks, flutters, swishes, rolls, or jabs in a 2-hour period. You should feel at least 10 movements within 2 hours. °4. If you do not feel 10 movements in 2 hours, wait 2-3 hours and count again. Look for a change in the pattern or not enough counts in 2 hours. °SEEK MEDICAL CARE IF: °· You feel less than 10 counts in 2 hours, tried twice. °· There is no movement in over an hour. °· The pattern is changing or taking longer each day to reach 10 counts in 2 hours. °· You feel the baby is not moving as he or she usually does. °Date: ____________ Movements: ____________ Start time: ____________ Finish time: ____________  °Date: ____________ Movements: ____________ Start time: ____________ Finish time: ____________ °Date: ____________ Movements: ____________ Start time: ____________ Finish time: ____________ °Date: ____________ Movements:  ____________ Start time: ____________ Finish time: ____________ °Date: ____________ Movements: ____________ Start time: ____________ Finish time: ____________ °Date: ____________ Movements: ____________ Start time: ____________ Finish time: ____________ °Date: ____________ Movements: ____________ Start time: ____________ Finish time: ____________ °Date: ____________ Movements: ____________ Start time: ____________ Finish time: ____________  °Date: ____________ Movements: ____________ Start time: ____________ Finish time: ____________ °Date: ____________ Movements: ____________ Start time: ____________ Finish time: ____________ °Date: ____________ Movements: ____________ Start time: ____________ Finish time: ____________ °Date: ____________ Movements: ____________ Start time: ____________ Finish time: ____________ °Date: ____________ Movements: ____________ Start time: ____________ Finish time: ____________ °Date: ____________ Movements: ____________ Start time: ____________ Finish time: ____________ °Date: ____________ Movements: ____________ Start time: ____________ Finish time: ____________  °Date: ____________ Movements: ____________ Start time: ____________ Finish time: ____________ °Date: ____________ Movements: ____________ Start time: ____________ Finish time: ____________ °Date: ____________ Movements: ____________ Start time: ____________ Finish time: ____________ °Date: ____________ Movements: ____________ Start time: ____________ Finish time: ____________ °Date: ____________ Movements: ____________ Start time: ____________ Finish time: ____________ °Date: ____________ Movements: ____________ Start time: ____________ Finish time: ____________ °Date: ____________ Movements: ____________ Start time: ____________ Finish time: ____________  °Date: ____________ Movements: ____________ Start time: ____________ Finish time: ____________ °Date: ____________ Movements: ____________ Start time: ____________ Finish  time: ____________ °Date: ____________ Movements: ____________ Start time: ____________ Finish time: ____________ °Date: ____________ Movements: ____________ Start time: ____________ Finish time: ____________ °Date: ____________ Movements: ____________ Start time: ____________ Finish time: ____________ °Date: ____________ Movements: ____________ Start time: ____________ Finish time: ____________ °Date: ____________ Movements: ____________ Start time: ____________ Finish time: ____________  °Date: ____________ Movements: ____________ Start time: ____________ Finish   time: ____________ °Date: ____________ Movements: ____________ Start time: ____________ Finish time: ____________ °Date: ____________ Movements: ____________ Start time: ____________ Finish time: ____________ °Date: ____________ Movements: ____________ Start time: ____________ Finish time: ____________ °Date: ____________ Movements: ____________ Start time: ____________ Finish time: ____________ °Date: ____________ Movements: ____________ Start time: ____________ Finish time: ____________ °Date: ____________ Movements: ____________ Start time: ____________ Finish time: ____________  °Date: ____________ Movements: ____________ Start time: ____________ Finish time: ____________ °Date: ____________ Movements: ____________ Start time: ____________ Finish time: ____________ °Date: ____________ Movements: ____________ Start time: ____________ Finish time: ____________ °Date: ____________ Movements: ____________ Start time: ____________ Finish time: ____________ °Date: ____________ Movements: ____________ Start time: ____________ Finish time: ____________ °Date: ____________ Movements: ____________ Start time: ____________ Finish time: ____________ °Date: ____________ Movements: ____________ Start time: ____________ Finish time: ____________  °Date: ____________ Movements: ____________ Start time: ____________ Finish time: ____________ °Date: ____________  Movements: ____________ Start time: ____________ Finish time: ____________ °Date: ____________ Movements: ____________ Start time: ____________ Finish time: ____________ °Date: ____________ Movements: ____________ Start time: ____________ Finish time: ____________ °Date: ____________ Movements: ____________ Start time: ____________ Finish time: ____________ °Date: ____________ Movements: ____________ Start time: ____________ Finish time: ____________ °Date: ____________ Movements: ____________ Start time: ____________ Finish time: ____________  °Date: ____________ Movements: ____________ Start time: ____________ Finish time: ____________ °Date: ____________ Movements: ____________ Start time: ____________ Finish time: ____________ °Date: ____________ Movements: ____________ Start time: ____________ Finish time: ____________ °Date: ____________ Movements: ____________ Start time: ____________ Finish time: ____________ °Date: ____________ Movements: ____________ Start time: ____________ Finish time: ____________ °Date: ____________ Movements: ____________ Start time: ____________ Finish time: ____________ °Document Released: 03/11/2006 Document Revised: 06/26/2013 Document Reviewed: 12/07/2011 °[employer]® Patient Information ©2015 [employer]. This information is not intended to replace advice given to you by your health care provider. Make sure you discuss any questions you have with your health care provider. °Braxton Hicks Contractions °Contractions of the uterus can occur throughout pregnancy. Contractions are not always a sign that you are in labor.  °WHAT ARE BRAXTON HICKS CONTRACTIONS?  °Contractions that occur before labor are called Braxton Hicks contractions, or false labor. Toward the end of pregnancy (32-34 weeks), these contractions can develop more often and may become more forceful. This is not true labor because these contractions do not result in opening (dilatation) and thinning of the cervix. They  are sometimes difficult to tell apart from true labor because these contractions can be forceful and people have different pain tolerances. You should not feel embarrassed if you go to the hospital with false labor. Sometimes, the only way to tell if you are in true labor is for your health care provider to look for changes in the cervix. °If there are no prenatal problems or other health problems associated with the pregnancy, it is completely safe to be sent home with false labor and await the onset of true labor. °HOW CAN YOU TELL THE DIFFERENCE BETWEEN TRUE AND FALSE LABOR? °False Labor °· The contractions of false labor are usually shorter and not as hard as those of true labor.   °· The contractions are usually irregular.   °· The contractions are often felt in the front of the lower abdomen and in the groin.   °· The contractions may go away when you walk around or change positions while lying down.   °· The contractions get weaker and are shorter lasting as time goes on.   °· The contractions do not usually become progressively stronger, regular, and closer together as with true labor.   °True Labor °· Contractions in true labor last 30-70 seconds, become   very regular, usually become more intense, and increase in frequency.   °· The contractions do not go away with walking.   °· The discomfort is usually felt in the top of the uterus and spreads to the lower abdomen and low back.   °· True labor can be determined by your health care provider with an exam. This will show that the cervix is dilating and getting thinner.   °WHAT TO REMEMBER °· Keep up with your usual exercises and follow other instructions given by your health care provider.   °· Take medicines as directed by your health care provider.   °· Keep your regular prenatal appointments.   °· Eat and drink lightly if you think you are going into labor.   °· If Braxton Hicks contractions are making you uncomfortable:   °¨ Change your position from  lying down or resting to walking, or from walking to resting.   °¨ Sit and rest in a tub of warm water.   °¨ Drink 2-3 glasses of water. Dehydration may cause these contractions.   °¨ Do slow and deep breathing several times an hour.   °WHEN SHOULD I SEEK IMMEDIATE MEDICAL CARE? °Seek immediate medical care if: °· Your contractions become stronger, more regular, and closer together.   °· You have fluid leaking or gushing from your vagina.   °· You have a fever.   °· You pass blood-tinged mucus.   °· You have vaginal bleeding.   °· You have continuous abdominal pain.   °· You have low back pain that you never had before.   °· You feel your baby's head pushing down and causing pelvic pressure.   °· Your baby is not moving as much as it used to.   °Document Released: 02/09/2005 Document Revised: 02/14/2013 Document Reviewed: 11/21/2012 °[employer]® Patient Information ©2015 [employer]. This information is not intended to replace advice given to you by your health care provider. Make sure you discuss any questions you have with your health care provider. ° °

## 2013-11-30 ENCOUNTER — Encounter (HOSPITAL_COMMUNITY): Payer: Self-pay | Admitting: *Deleted

## 2013-11-30 ENCOUNTER — Telehealth (HOSPITAL_COMMUNITY): Payer: Self-pay | Admitting: *Deleted

## 2013-11-30 NOTE — Telephone Encounter (Signed)
Preadmission screen  

## 2013-12-04 ENCOUNTER — Encounter (HOSPITAL_COMMUNITY): Payer: Self-pay

## 2013-12-04 DIAGNOSIS — Z3493 Encounter for supervision of normal pregnancy, unspecified, third trimester: Secondary | ICD-10-CM

## 2013-12-04 HISTORY — DX: Encounter for supervision of normal pregnancy, unspecified, third trimester: Z34.93

## 2013-12-04 NOTE — H&P (Signed)
[place] D Fortino Sicunn is a 33 y.o. female pG5P3013 at 7239 wk.  +FM, no LOF, no VB, occ ctx.  Relatively uncomplicated PNC.  H/o ectopic pregnancy,  Pregnancy dated bu 6 wk US cw 1st Tri US,  H/o PPD Maternal Medical History:  Contractions: Frequency: irregular.    Fetal activity: Perceived fetal activity is normal.    Prenatal Complications - Diabetes: none.    OB History   Grav Para Term Preterm Abortions TAB SAB Ect Mult Living   5 3 3  0 1 0 0 1 0 3    Ectopic - salpingectomy SVD x3 H/o abn pap, colpo, nl since No STDs   Past Medical History  Diagnosis Date  . Supervision of normal pregnancy 11/02/2010  . Scoliosis     mild  . No pertinent past medical history   . Hx of pyelonephritis   . Hx of chlamydia infection   . Alcoholism in recovery   . Normal pregnancy in third trimester 12/04/2013   Past Surgical History  Procedure Laterality Date  . Cholecystectomy  7 years ago  . Ectopic pregnancy surgery  2-3 years ago    just tube removal  . Salpingectomy  2008    right  . No past surgeries     Family History: family history includes Depression in her paternal grandfather; Diabetes in her father.CVA, breast CA, pancreatic CA Social History:  reports that she has been smoking Cigarettes.  She has been smoking about 0.25 packs per day. She has never used smokeless tobacco. She reports that she does not drink alcohol or use illicit drugs.SAHM, engaged Meds PNV All; NKDA   Prenatal Transfer Tool  Maternal Diabetes: No Genetic Screening: Normal Maternal Ultrasounds/Referrals: Normal Fetal Ultrasounds or other Referrals:  None Maternal Substance Abuse:  No, h/o MJ Significant Maternal Medications:  None Significant Maternal Lab Results:  Lab values include: Group B Strep positive Other Comments:  None  Review of Systems  Constitutional: Negative.   HENT: Negative.   Eyes: Negative.   Respiratory: Negative.   Cardiovascular: Negative.   Gastrointestinal: Negative.    Genitourinary: Negative.   Musculoskeletal: Negative.   Skin: Negative.   Neurological: Negative.   Psychiatric/Behavioral: Negative.       Last menstrual period 03/07/2013, currently breastfeeding. Maternal Exam:  Uterine Assessment: Contraction frequency is irregular.   Abdomen: Fundal height is appropriate for gectation.   Estimated fetal weight is 6.5-7#.   Fetal presentation: vertex  Introitus: Normal vulva. Normal vagina.  Pelvis: adequate for delivery.   Cervix: Cervix evaluated by digital exam.     Physical Exam  Constitutional: She is oriented to person, place, and time. She appears well-developed and well-nourished.  HENT:  Head: Normocephalic and atraumatic.  Cardiovascular: Normal rate and regular rhythm.   Respiratory: Effort normal and breath sounds normal. No respiratory distress. She has no wheezes.  GI: Soft. Bowel sounds are normal. She exhibits no distension. There is no tenderness.  Musculoskeletal: Normal range of motion.  Neurological: She is alert and oriented to person, place, and time.  Skin: Skin is warm and dry.  Psychiatric: She has a normal mood and affect. Her behavior is normal.    Prenatal labs: ABO, Rh: O/Positive/-- (03/27 0000) Antibody: Negative (03/27 0000) Rubella: Immune (03/27 0000) RPR: Nonreactive (03/27 0000)  HBsAg: Negative (03/27 0000)  HIV: Non-reactive (03/27 0000)  GBS: Positive (09/16 0000)   Hgb 13.1/Ur Cx neg/ GC neg/ Chl neg/GC neg/ Chl neg/First Tri Scre WNL/  US dated by  US First tri cwd LMP Nl NT  nl anat, fundal plac, female Tdap 7/15; Flu 9/15 Assessment/Plan: 33yo G5 P3013 at 37 week IOL with pitocin ARO after PCN, for GBBS Epidural prn   Bovard-Stuckert, Floyde Dingley 12/04/2013, 10:23 PM

## 2013-12-05 ENCOUNTER — Inpatient Hospital Stay (HOSPITAL_COMMUNITY): Payer: Medicaid Other | Admitting: Anesthesiology

## 2013-12-05 ENCOUNTER — Inpatient Hospital Stay (HOSPITAL_COMMUNITY)
Admission: RE | Admit: 2013-12-05 | Discharge: 2013-12-06 | DRG: 775 | Disposition: A | Discharge status: Home / Self Care | Payer: Medicaid Other | Source: Ambulatory Visit | Attending: Obstetrics and Gynecology | Admitting: Obstetrics and Gynecology

## 2013-12-05 ENCOUNTER — Encounter (HOSPITAL_COMMUNITY): Payer: Medicaid Other | Admitting: Anesthesiology

## 2013-12-05 ENCOUNTER — Encounter (HOSPITAL_COMMUNITY): Payer: Self-pay

## 2013-12-05 VITALS — BP 120/73 | HR 87 | Temp 98.2°F | Resp 18 | Ht 67.0 in | Wt 160.0 lb

## 2013-12-05 DIAGNOSIS — Z823 Family history of stroke: Secondary | ICD-10-CM | POA: Diagnosis not present

## 2013-12-05 DIAGNOSIS — O9989 Other specified diseases and conditions complicating pregnancy, childbirth and the puerperium: Secondary | ICD-10-CM | POA: Diagnosis present

## 2013-12-05 DIAGNOSIS — O99824 Streptococcus B carrier state complicating childbirth: Secondary | ICD-10-CM | POA: Diagnosis present

## 2013-12-05 DIAGNOSIS — Z3A39 39 weeks gestation of pregnancy: Secondary | ICD-10-CM | POA: Diagnosis present

## 2013-12-05 DIAGNOSIS — Z3480 Encounter for supervision of other normal pregnancy, unspecified trimester: Secondary | ICD-10-CM

## 2013-12-05 DIAGNOSIS — Z3493 Encounter for supervision of normal pregnancy, unspecified, third trimester: Secondary | ICD-10-CM

## 2013-12-05 DIAGNOSIS — M419 Scoliosis, unspecified: Secondary | ICD-10-CM | POA: Diagnosis present

## 2013-12-05 DIAGNOSIS — Z833 Family history of diabetes mellitus: Secondary | ICD-10-CM

## 2013-12-05 DIAGNOSIS — O99334 Smoking (tobacco) complicating childbirth: Secondary | ICD-10-CM | POA: Diagnosis present

## 2013-12-05 DIAGNOSIS — F17210 Nicotine dependence, cigarettes, uncomplicated: Secondary | ICD-10-CM | POA: Diagnosis present

## 2013-12-05 DIAGNOSIS — Z3483 Encounter for supervision of other normal pregnancy, third trimester: Secondary | ICD-10-CM | POA: Diagnosis present

## 2013-12-05 HISTORY — DX: Encounter for supervision of normal pregnancy, unspecified, third trimester: Z34.93

## 2013-12-05 HISTORY — DX: Unspecified abnormal cytological findings in specimens from vagina: R87.629

## 2013-12-05 LAB — CBC
HEMATOCRIT: 33.2 % — AB (ref 36.0–46.0)
Hemoglobin: 11.6 g/dL — ABNORMAL LOW (ref 12.0–15.0)
MCH: 30.4 pg (ref 26.0–34.0)
MCHC: 34.9 g/dL (ref 30.0–36.0)
MCV: 87.1 fL (ref 78.0–100.0)
Platelets: 153 10*3/uL (ref 150–400)
RBC: 3.81 MIL/uL — ABNORMAL LOW (ref 3.87–5.11)
RDW: 13.9 % (ref 11.5–15.5)
WBC: 10.6 10*3/uL — AB (ref 4.0–10.5)

## 2013-12-05 LAB — TYPE AND SCREEN
ABO/RH(D): O POS
Antibody Screen: NEGATIVE

## 2013-12-05 LAB — RPR

## 2013-12-05 MED ORDER — PNEUMOCOCCAL VAC POLYVALENT 25 MCG/0.5ML IJ INJ
0.5000 mL | INJECTION | INTRAMUSCULAR | Status: AC
  Administered 2013-12-06: 0.5 mL via INTRAMUSCULAR
  Filled 2013-12-05: qty 0.5

## 2013-12-05 MED ORDER — OXYTOCIN 40 UNITS IN LACTATED RINGERS INFUSION - SIMPLE MED
1.0000 m[IU]/min | INTRAVENOUS | Status: DC
  Administered 2013-12-05: 2 m[IU]/min via INTRAVENOUS
  Filled 2013-12-05: qty 1000

## 2013-12-05 MED ORDER — IBUPROFEN 600 MG PO TABS
600.0000 mg | ORAL_TABLET | Freq: Four times a day (QID) | ORAL | Status: DC
  Administered 2013-12-05: 600 mg via ORAL
  Filled 2013-12-05: qty 1

## 2013-12-05 MED ORDER — DIPHENHYDRAMINE HCL 50 MG/ML IJ SOLN: 12.5000 mg | INTRAMUSCULAR | Status: DC | PRN

## 2013-12-05 MED ORDER — ONDANSETRON HCL 4 MG/2ML IJ SOLN: 4.0000 mg | INTRAMUSCULAR | Status: DC | PRN

## 2013-12-05 MED ORDER — EPHEDRINE 5 MG/ML INJ
10.0000 mg | INTRAVENOUS | Status: DC | PRN
  Filled 2013-12-05: qty 2

## 2013-12-05 MED ORDER — PHENYLEPHRINE 40 MCG/ML (10ML) SYRINGE FOR IV PUSH (FOR BLOOD PRESSURE SUPPORT)
80.0000 ug | PREFILLED_SYRINGE | INTRAVENOUS | Status: DC | PRN
Start: 2013-12-05 — End: 2013-12-05
  Filled 2013-12-05: qty 10
  Filled 2013-12-05: qty 2

## 2013-12-05 MED ORDER — OXYCODONE-ACETAMINOPHEN 5-325 MG PO TABS
1.0000 | ORAL_TABLET | ORAL | Status: DC | PRN
  Administered 2013-12-06: 1 via ORAL
  Filled 2013-12-05: qty 1

## 2013-12-05 MED ORDER — LACTATED RINGERS IV SOLN: 500.0000 mL | INTRAVENOUS | Status: DC | PRN

## 2013-12-05 MED ORDER — OXYMETAZOLINE HCL 0.05 % NA SOLN: 1.0000 | Freq: Every day | NASAL | Status: DC | PRN

## 2013-12-05 MED ORDER — LIDOCAINE HCL (PF) 1 % IJ SOLN
30.0000 mL | INTRAMUSCULAR | Status: DC | PRN
  Filled 2013-12-05: qty 30

## 2013-12-05 MED ORDER — ONDANSETRON HCL 4 MG PO TABS: 4.0000 mg | ORAL_TABLET | ORAL | Status: DC | PRN

## 2013-12-05 MED ORDER — CITRIC ACID-SODIUM CITRATE 334-500 MG/5ML PO SOLN: 30.0000 mL | ORAL | Status: DC | PRN

## 2013-12-05 MED ORDER — OXYTOCIN BOLUS FROM INFUSION
500.0000 mL | INTRAVENOUS | Status: DC
  Administered 2013-12-05: 500 mL via INTRAVENOUS

## 2013-12-05 MED ORDER — TERBUTALINE SULFATE 1 MG/ML IJ SOLN: 0.2500 mg | Freq: Once | INTRAMUSCULAR | Status: DC | PRN

## 2013-12-05 MED ORDER — LACTATED RINGERS IV SOLN
500.0000 mL | Freq: Once | INTRAVENOUS | Status: AC
  Administered 2013-12-05: 500 mL via INTRAVENOUS

## 2013-12-05 MED ORDER — CALCIUM CARBONATE ANTACID 500 MG PO CHEW: 1.0000 | CHEWABLE_TABLET | Freq: Two times a day (BID) | ORAL | Status: DC | PRN

## 2013-12-05 MED ORDER — ACETAMINOPHEN 325 MG PO TABS: 650.0000 mg | ORAL_TABLET | ORAL | Status: DC | PRN

## 2013-12-05 MED ORDER — OXYTOCIN 40 UNITS IN LACTATED RINGERS INFUSION - SIMPLE MED: 62.5000 mL/h | INTRAVENOUS | Status: DC

## 2013-12-05 MED ORDER — OXYCODONE-ACETAMINOPHEN 5-325 MG PO TABS: 2.0000 | ORAL_TABLET | ORAL | Status: DC | PRN

## 2013-12-05 MED ORDER — ONDANSETRON HCL 4 MG/2ML IJ SOLN: 4.0000 mg | Freq: Four times a day (QID) | INTRAMUSCULAR | Status: DC | PRN

## 2013-12-05 MED ORDER — BENZOCAINE-MENTHOL 20-0.5 % EX AERO
1.0000 "application " | INHALATION_SPRAY | CUTANEOUS | Status: DC | PRN
  Filled 2013-12-05: qty 56

## 2013-12-05 MED ORDER — DIPHENHYDRAMINE HCL 25 MG PO CAPS: 25.0000 mg | ORAL_CAPSULE | Freq: Four times a day (QID) | ORAL | Status: DC | PRN

## 2013-12-05 MED ORDER — DEXTROSE 5 % IV SOLN
2.5000 10*6.[IU] | INTRAVENOUS | Status: DC
  Administered 2013-12-05: 2.5 10*6.[IU] via INTRAVENOUS
  Filled 2013-12-05 (×4): qty 2.5

## 2013-12-05 MED ORDER — OXYCODONE-ACETAMINOPHEN 5-325 MG PO TABS
2.0000 | ORAL_TABLET | ORAL | Status: DC | PRN
Start: 2013-12-05 — End: 2013-12-06
  Administered 2013-12-06: 2 via ORAL
  Filled 2013-12-05: qty 2

## 2013-12-05 MED ORDER — FENTANYL 2.5 MCG/ML BUPIVACAINE 1/10 % EPIDURAL INFUSION (WH - ANES)
14.0000 mL/h | INTRAMUSCULAR | Status: DC | PRN
  Administered 2013-12-05: 14 mL/h via EPIDURAL
  Filled 2013-12-05: qty 125

## 2013-12-05 MED ORDER — LANOLIN HYDROUS EX OINT: TOPICAL_OINTMENT | CUTANEOUS | Status: DC | PRN

## 2013-12-05 MED ORDER — OXYCODONE-ACETAMINOPHEN 5-325 MG PO TABS: 1.0000 | ORAL_TABLET | ORAL | Status: DC | PRN

## 2013-12-05 MED ORDER — SIMETHICONE 80 MG PO CHEW: 80.0000 mg | CHEWABLE_TABLET | ORAL | Status: DC | PRN

## 2013-12-05 MED ORDER — PHENYLEPHRINE 40 MCG/ML (10ML) SYRINGE FOR IV PUSH (FOR BLOOD PRESSURE SUPPORT)
80.0000 ug | PREFILLED_SYRINGE | INTRAVENOUS | Status: DC | PRN
  Filled 2013-12-05: qty 2

## 2013-12-05 MED ORDER — ZOLPIDEM TARTRATE 5 MG PO TABS: 5.0000 mg | ORAL_TABLET | Freq: Every evening | ORAL | Status: DC | PRN

## 2013-12-05 MED ORDER — PRENATAL MULTIVITAMIN CH: 1.0000 | ORAL_TABLET | Freq: Every day | ORAL | Status: DC

## 2013-12-05 MED ORDER — WITCH HAZEL-GLYCERIN EX PADS: 1.0000 "application " | MEDICATED_PAD | CUTANEOUS | Status: DC | PRN

## 2013-12-05 MED ORDER — LACTATED RINGERS IV SOLN
INTRAVENOUS | Status: DC
  Administered 2013-12-05 (×2): via INTRAVENOUS

## 2013-12-05 MED ORDER — LACTATED RINGERS IV SOLN: INTRAVENOUS | Status: DC

## 2013-12-05 MED ORDER — SENNOSIDES-DOCUSATE SODIUM 8.6-50 MG PO TABS
2.0000 | ORAL_TABLET | ORAL | Status: DC
  Administered 2013-12-06: 2 via ORAL
  Filled 2013-12-05: qty 2

## 2013-12-05 MED ORDER — DIBUCAINE 1 % RE OINT: 1.0000 "application " | TOPICAL_OINTMENT | RECTAL | Status: DC | PRN

## 2013-12-05 MED ORDER — DEXTROSE 5 % IV SOLN
5.0000 10*6.[IU] | Freq: Once | INTRAVENOUS | Status: AC
  Administered 2013-12-05: 5 10*6.[IU] via INTRAVENOUS
  Filled 2013-12-05: qty 5

## 2013-12-05 NOTE — Progress Notes (Signed)
Patient ID: Stacy [name], female   DOB: 09/11/1980, 33 y.o.   MRN: [id number]  Comfortable with epidural  AFVSS gen NAD FHTs 120's, good variability, category 1 toco Q 3min  AROM for clear fluid, w/o difficulty or complication  SVE 3.4/90/0  33yo G5P3013 at 39 wk for IOL Plan for SVD

## 2013-12-05 NOTE — Progress Notes (Signed)
Patient ID: Stacy [name], female   DOB: 03/14/1980, 33 y.o.   MRN: [id number]  No c/o's.  Some cramping.  +FM, no LOF, no VB  AFVSS  gen NAD FHTs 120's, R, category 1' toco q 2-4  Reviewed history, no changes Reviewed POC Plan for AROM 4hr after PCN

## 2013-12-05 NOTE — Anesthesia Preprocedure Evaluation (Signed)
Anesthesia Evaluation  Patient identified by MRN, date of birth, ID band Patient awake    Reviewed: Allergy & Precautions, H&P , NPO status , Patient's Chart, lab work & pertinent test results  Airway Mallampati: I TM Distance: >3 FB Neck ROM: full    Dental no notable dental hx.    Pulmonary Current Smoker,    Pulmonary exam normal       Cardiovascular negative cardio ROS      Neuro/Psych negative neurological ROS     GI/Hepatic negative GI ROS, Neg liver ROS,   Endo/Other  negative endocrine ROS  Renal/GU negative Renal ROS     Musculoskeletal   Abdominal Normal abdominal exam  (+)   Peds  Hematology negative hematology ROS (+)   Anesthesia Other Findings   Reproductive/Obstetrics (+) Pregnancy                           Anesthesia Physical Anesthesia Plan  ASA: II  Anesthesia Plan: Epidural   Post-op Pain Management:    Induction:   Airway Management Planned:   Additional Equipment:   Intra-op Plan:   Post-operative Plan:   Informed Consent: I have reviewed the patients History and Physical, chart, labs and discussed the procedure including the risks, benefits and alternatives for the proposed anesthesia with the patient or authorized representative who has indicated his/her understanding and acceptance.     Plan Discussed with:   Anesthesia Plan Comments:         Anesthesia Quick Evaluation  

## 2013-12-06 LAB — CBC
HCT: 33.4 % — ABNORMAL LOW (ref 36.0–46.0)
Hemoglobin: 11.8 g/dL — ABNORMAL LOW (ref 12.0–15.0)
MCH: 31 pg (ref 26.0–34.0)
MCHC: 35.3 g/dL (ref 30.0–36.0)
MCV: 87.7 fL (ref 78.0–100.0)
PLATELETS: 149 10*3/uL — AB (ref 150–400)
RBC: 3.81 MIL/uL — ABNORMAL LOW (ref 3.87–5.11)
RDW: 14.1 % (ref 11.5–15.5)
WBC: 13.4 10*3/uL — AB (ref 4.0–10.5)

## 2013-12-06 LAB — CCBB MATERNAL DONOR DRAW

## 2013-12-06 MED ORDER — IBUPROFEN 100 MG/5ML PO SUSP: 800.0000 mg | Freq: Three times a day (TID) | ORAL | Status: DC | PRN

## 2013-12-06 MED ORDER — PRENATAL MULTIVITAMIN CH: 1.0000 | ORAL_TABLET | Freq: Every day | ORAL | Status: DC

## 2013-12-06 MED ORDER — ACETAMINOPHEN 160 MG/5ML PO SOLN: 325.0000 mg | ORAL | Status: DC | PRN

## 2013-12-06 MED ORDER — IBUPROFEN 100 MG/5ML PO SUSP
600.0000 mg | Freq: Four times a day (QID) | ORAL | Status: DC
  Administered 2013-12-06 (×3): 600 mg via ORAL
  Filled 2013-12-06 (×6): qty 30

## 2013-12-06 MED ORDER — OXYCODONE HCL 5 MG/5ML PO SOLN: 5.0000 mg | Freq: Four times a day (QID) | ORAL | Status: DC | PRN

## 2013-12-06 MED ORDER — ACETAMINOPHEN 160 MG/5ML PO SOLN: 325.0000 mg | Freq: Four times a day (QID) | ORAL | Status: DC | PRN

## 2013-12-06 MED ORDER — COMPLETENATE 29-1 MG PO CHEW
1.0000 | CHEWABLE_TABLET | Freq: Every day | ORAL | Status: DC
  Administered 2013-12-06: 1 via ORAL
  Filled 2013-12-06 (×2): qty 1

## 2013-12-06 MED ORDER — OXYCODONE HCL 5 MG/5ML PO SOLN
5.0000 mg | ORAL | Status: DC | PRN
  Administered 2013-12-06: 10 mg via ORAL
  Administered 2013-12-06: 5 mg via ORAL
  Filled 2013-12-06: qty 5
  Filled 2013-12-06: qty 10

## 2013-12-06 NOTE — Lactation Note (Signed)
This note was copied from the chart of Stacy [name]. Lactation Consultation Note Initial consultation; baby 5718 hours old. Mom states breastfeeding is going very well; denies breast pain. Mom breastfed one of her older children for 2 years without difficulty. Reviewed br feeding basics, baby and me book, lactation brochure, community resources.  Enc mom to continue frequent STS and cue based feeding, and to call for help if needed.   Patient Name: Stacy Donnal MoatCrystal [name] Today's Date: 12/06/2013 Reason for consult: Initial assessment   Maternal Data Has patient been taught Hand Expression?: Yes Does the patient have breastfeeding experience prior to this delivery?: Yes  Feeding    LATCH Score/Interventions                      Lactation Tools Discussed/Used     Consult Status Consult Status: Complete    Lenard ForthSanders, Blayze Haen Fulmer 12/06/2013, 3:50 PM

## 2013-12-06 NOTE — Discharge Summary (Signed)
Obstetric Discharge Summary Reason for Admission: induction of labor Prenatal Procedures: none Intrapartum Procedures: spontaneous vaginal delivery Postpartum Procedures: none Complications-Operative and Postpartum: 1st degree perineal laceration Hemoglobin  Date Value Ref Range Status  12/06/2013 11.8* 12.0 - 15.0 g/dL Final     HCT  Date Value Ref Range Status  12/06/2013 33.4* 36.0 - 46.0 % Final    Physical Exam:  General: alert and no distress Lochia: appropriate Uterine Fundus: firm  Discharge Diagnoses: Term Pregnancy-delivered  Discharge Information: Date: 12/06/2013 Activity: pelvic rest Diet: routine Medications: PNV, Ibuprofen and Percocet Condition: stable Instructions: refer to practice specific booklet Discharge to: home Follow-up Information   Follow up with Stacy [name], Stacy Vandam, Stacy [name]. Schedule an appointment as soon as possible for a visit in 6 weeks. (for postpartum check)    Specialty:  Obstetrics and Gynecology   Contact information:   [address] [place] [id number] [phone]       Newborn Data: Live born female  Birth Weight: 6 lb 8.4 oz (2960 g) APGAR: 9, 10  Home with mother.  Stacy [name], Stacy [name] 12/06/2013, 9:56 AM

## 2013-12-06 NOTE — Progress Notes (Signed)
MOB was referred for history of depression/anxiety.  Referral screened out by Clinical Social Worker because none of the following criteria appear to apply: - History of anxiety/depression during this pregnancy, or of post-partum depression. - Diagnosis of anxiety and/or depression within last 3 years - History of depression due to pregnancy loss/loss of child OR -MOB's symptoms currently being treated with medication and/or therapy.  Please contact the Clinical Social Worker if needs arise or upon MOB's request.   CSW completed chart review and noted that MOB presented with a history of postpartum depression in 2002 during a custody battle.  CSW met with the MOB in 2013 and the MOB presented with no concerns and denied additional symptoms of depression and anxiety since 2002.  MOB was re-educated in 2013 on postpartum depression.  No need to complete assessment at this time, but is available upon request.

## 2013-12-06 NOTE — Progress Notes (Signed)
Post Partum Day 1 Subjective: no complaints, up ad lib, voiding, tolerating PO and nl lochia, pain controlled  Objective: Blood pressure 136/89, pulse 60, temperature 98.3 F (36.8 C), temperature source Oral, resp. rate 18, height 5\' 7"  (1.702 m), weight 72.576 kg (160 lb), last menstrual period 03/07/2013, SpO2 100.00%, unknown if currently breastfeeding.  Physical Exam:  General: alert and no distress Lochia: appropriate Uterine Fundus: firm  Recent Labs  12/05/13 1145 12/06/13 0550  HGB 11.6* 11.8*  HCT 33.2* 33.4*    Assessment/Plan: Discharge home, Breastfeeding and Lactation consult.  D/c with motrin, percocet and PNV, f/u 6 weeks   LOS: 1 day   Bovard-Stuckert, Richele Strand 12/06/2013, 9:49 AM

## 2013-12-07 MED ORDER — FENTANYL 2.5 MCG/ML BUPIVACAINE 1/10 % EPIDURAL INFUSION (WH - ANES)
INTRAMUSCULAR | Status: DC | PRN
  Administered 2013-12-05: 14 mL/h via EPIDURAL

## 2013-12-07 MED ORDER — LIDOCAINE HCL (PF) 1 % IJ SOLN
INTRAMUSCULAR | Status: DC | PRN
Start: 2013-12-05 — End: 2013-12-07
  Administered 2013-12-05 (×2): 8 mL

## 2013-12-07 NOTE — Anesthesia Procedure Notes (Signed)
Epidural Patient location during procedure: OB Start time: 12/05/2013 4:04 PM End time: 12/05/2013 4:08 PM  Staffing Anesthesiologist: Leilani AbleHATCHETT, Karne Ozga  Preanesthetic Checklist Completed: patient identified, surgical consent, pre-op evaluation, timeout performed, IV checked, risks and benefits discussed and monitors and equipment checked  Epidural Patient position: sitting Prep: site prepped and draped and DuraPrep Patient monitoring: continuous pulse ox and blood pressure Approach: midline Location: L3-L4 Injection technique: LOR air  Needle:  Needle type: Tuohy  Needle gauge: 17 G Needle length: 9 cm and 9 Needle insertion depth: 5 cm cm Catheter type: closed end flexible Catheter size: 19 Gauge Catheter at skin depth: 10 cm Test dose: negative and Other  Assessment Sensory level: T9 Events: blood not aspirated, injection not painful, no injection resistance, negative IV test and no paresthesia  Additional Notes Reason for block:procedure for pain

## 2013-12-07 NOTE — Anesthesia Postprocedure Evaluation (Signed)
Anesthesia Post Note  Patient: Stacy [name]  Procedure(s) Performed: * No procedures listed *  Anesthesia type: Epidural  Patient location: Mother/Baby  Post pain: Pain level controlled  Post assessment: Post-op Vital signs reviewed  Last Vitals: There were no vitals filed for this visit.  Post vital signs: Reviewed  Level of consciousness: awake  Complications: No apparent anesthesia complications

## 2013-12-11 NOTE — Progress Notes (Signed)
Post discharge chart review completed.  

## 2013-12-25 ENCOUNTER — Encounter (HOSPITAL_COMMUNITY): Payer: Self-pay

## 2015-04-20 ENCOUNTER — Emergency Department (HOSPITAL_COMMUNITY)
Admission: EM | Admit: 2015-04-20 | Discharge: 2015-04-20 | Disposition: A | Discharge status: Home / Self Care | Payer: Medicaid Other | Attending: Emergency Medicine | Admitting: Emergency Medicine

## 2015-04-20 ENCOUNTER — Encounter (HOSPITAL_COMMUNITY): Payer: Self-pay | Admitting: Emergency Medicine

## 2015-04-20 DIAGNOSIS — R220 Localized swelling, mass and lump, head: Secondary | ICD-10-CM | POA: Diagnosis not present

## 2015-04-20 DIAGNOSIS — Z8619 Personal history of other infectious and parasitic diseases: Secondary | ICD-10-CM | POA: Insufficient documentation

## 2015-04-20 DIAGNOSIS — Z8659 Personal history of other mental and behavioral disorders: Secondary | ICD-10-CM | POA: Insufficient documentation

## 2015-04-20 DIAGNOSIS — Z87448 Personal history of other diseases of urinary system: Secondary | ICD-10-CM | POA: Diagnosis not present

## 2015-04-20 DIAGNOSIS — Z79899 Other long term (current) drug therapy: Secondary | ICD-10-CM | POA: Diagnosis not present

## 2015-04-20 DIAGNOSIS — J029 Acute pharyngitis, unspecified: Secondary | ICD-10-CM | POA: Diagnosis not present

## 2015-04-20 DIAGNOSIS — Z8739 Personal history of other diseases of the musculoskeletal system and connective tissue: Secondary | ICD-10-CM | POA: Insufficient documentation

## 2015-04-20 DIAGNOSIS — F17210 Nicotine dependence, cigarettes, uncomplicated: Secondary | ICD-10-CM | POA: Diagnosis not present

## 2015-04-20 MED ORDER — IBUPROFEN 800 MG PO TABS
800.0000 mg | ORAL_TABLET | Freq: Once | ORAL | Status: AC
  Administered 2015-04-20: 800 mg via ORAL
  Filled 2015-04-20: qty 1

## 2015-04-20 MED ORDER — PENICILLIN G BENZATHINE 1200000 UNIT/2ML IM SUSP
1.2000 10*6.[IU] | Freq: Once | INTRAMUSCULAR | Status: AC
  Administered 2015-04-20: 1.2 10*6.[IU] via INTRAMUSCULAR
  Filled 2015-04-20: qty 2

## 2015-04-20 NOTE — Discharge Instructions (Signed)

## 2015-04-20 NOTE — ED Notes (Signed)
Pt arrives by POV with c/o swollen uvula. Pt was sleeping and woke up with sensation of something touching her tongue. Looked in mirror and it was her uvula. States it is affecting her ability to swallow and gagging her as well. Pt became anxious worrying about choking and came to ED.

## 2015-04-20 NOTE — ED Provider Notes (Signed)
CSN: [id number]     Arrival date & time 04/20/15  4403 History   First MD Initiated Contact with Patient 04/20/15 [phone]     Chief Complaint  Patient presents with  . Oral Swelling     (Consider location/radiation/quality/duration/timing/severity/associated sxs/prior Treatment) HPI Comments: Patient presents to the ED with a chief complaint of sore throat.  She states that she has had a sore throat for the past day or so. It worsened during the night. She states that it felt like her uvula was swollen.  She has not tried taking anything for the symptoms.  She denies any other associated symptoms.  She states that she has pain with swallowing, but is able to swallow.  The history is provided by the patient. No language interpreter was used.    Past Medical History  Diagnosis Date  . Supervision of normal pregnancy 11/02/2010  . Scoliosis     mild  . No pertinent past medical history   . Hx of pyelonephritis   . Hx of chlamydia infection   . Normal pregnancy in third trimester 12/04/2013  . Vaginal Pap smear, abnormal   . Alcoholism in recovery ([facility])     Been in recovery since 2010  . SVD (spontaneous vaginal delivery) 12/05/2013   Past Surgical History  Procedure Laterality Date  . Cholecystectomy  7 years ago  . Ectopic pregnancy surgery  2-3 years ago    just tube removal  . Salpingectomy  2008    right  . No past surgeries     Family History  Problem Relation Age of Onset  . Diabetes Father   . Depression Paternal Grandfather    Social History  Substance Use Topics  . Smoking status: Current Every Day Smoker -- 0.50 packs/day for 15 years    Types: Cigarettes  . Smokeless tobacco: Never Used  . Alcohol Use: Yes     Comment: occasional   OB History    Gravida Para Term Preterm AB TAB SAB Ectopic Multiple Living   0 1 0 0 1 0 4     Review of Systems  All other systems reviewed and are negative.     Allergies  Review of patient's allergies indicates no  known allergies.  Home Medications   Prior to Admission medications   Medication Sig Start Date End Date Taking? Authorizing Provider  acetaminophen (TYLENOL) 160 MG/5ML solution Take 10.2-20.3 mLs (325-650 mg total) by mouth every 6 (six) hours as needed for moderate pain. 12/06/13   Sherian Rein, MD  Aspirin-Acetaminophen-Caffeine [phone] MG PACK Take 1 Package by mouth daily as needed (headache).    Historical Provider, MD  calcium carbonate (TUMS - DOSED IN MG ELEMENTAL CALCIUM) 500 MG chewable tablet Chew 1 tablet by mouth 2 (two) times daily as needed for indigestion or heartburn.    Historical Provider, MD  ibuprofen (ADVIL,MOTRIN) 100 MG/5ML suspension Take 40 mLs (800 mg total) by mouth every 8 (eight) hours as needed. 12/06/13   Jody Bovard-Stuckert, MD  oxyCODONE (ROXICODONE) 5 MG/5ML solution Take 5-10 mLs (5-10 mg total) by mouth every 6 (six) hours as needed for severe pain. 12/06/13   Sherian Rein, MD  oxymetazoline (AFRIN) 0.05 % nasal spray Place 1 spray into both nostrils daily as needed for congestion.    Historical Provider, MD  Prenatal Vit-Fe Fumarate-FA (PRENATAL MULTIVITAMIN) TABS tablet Take 1 tablet by mouth daily. 12/06/13   Jody Bovard-Stuckert, MD   BP 125/87 mmHg  Pulse 91  Temp(Src) 98 F (36.7 C) (Oral)  Resp 18  SpO2 99%  LMP 04/17/2015 Physical Exam  Constitutional: She is oriented to person, place, and time. She appears well-developed and well-nourished.  HENT:  Head: Normocephalic and atraumatic.  Moderate erythema of oropharynx No exudates No abscess Airway is patent No stridor  Eyes: Conjunctivae and EOM are normal.  Neck: Normal range of motion.  Cardiovascular: Normal rate, regular rhythm and normal heart sounds.  Exam reveals no gallop and no friction rub.   No murmur heard. Pulmonary/Chest: Effort normal and breath sounds normal. No respiratory distress. She has no wheezes. She has no rales. She exhibits no tenderness.   Abdominal: She exhibits no distension.  Musculoskeletal: Normal range of motion.  Lymphadenopathy:    She has cervical adenopathy.  Neurological: She is alert and oriented to person, place, and time.  Skin: Skin is dry.  Psychiatric: She has a normal mood and affect. Her behavior is normal. Judgment and thought content normal.  Nursing note and vitals reviewed.   ED Course  Procedures (including critical care time)   MDM   Final diagnoses:  Pharyngitis    Patient with pharyngitis.  Airway is patent.  Will give Pen IM.  Will discharge to home.  No abscess.    Roxy Horseman, PA-C 04/20/15 0415  Roxy Horseman, PA-C 04/20/15 6962  Dione Booze, MD 04/20/15 [phone]

## 2015-10-30 ENCOUNTER — Emergency Department (HOSPITAL_COMMUNITY): Payer: Medicaid Other

## 2015-10-30 ENCOUNTER — Emergency Department (HOSPITAL_COMMUNITY)
Admission: EM | Admit: 2015-10-30 | Discharge: 2015-10-30 | Disposition: A | Discharge status: Home / Self Care | Payer: Medicaid Other | Attending: Emergency Medicine | Admitting: Emergency Medicine

## 2015-10-30 ENCOUNTER — Encounter (HOSPITAL_COMMUNITY): Payer: Self-pay | Admitting: *Deleted

## 2015-10-30 DIAGNOSIS — F17210 Nicotine dependence, cigarettes, uncomplicated: Secondary | ICD-10-CM | POA: Insufficient documentation

## 2015-10-30 DIAGNOSIS — F419 Anxiety disorder, unspecified: Secondary | ICD-10-CM | POA: Diagnosis not present

## 2015-10-30 DIAGNOSIS — R0789 Other chest pain: Secondary | ICD-10-CM | POA: Diagnosis not present

## 2015-10-30 DIAGNOSIS — Z7982 Long term (current) use of aspirin: Secondary | ICD-10-CM | POA: Diagnosis not present

## 2015-10-30 DIAGNOSIS — R079 Chest pain, unspecified: Secondary | ICD-10-CM | POA: Diagnosis present

## 2015-10-30 MED ORDER — ALPRAZOLAM 0.5 MG PO TABS: 0.5000 mg | ORAL_TABLET | Freq: Every evening | ORAL | 0 refills | Status: DC | PRN

## 2015-10-30 NOTE — ED Provider Notes (Signed)
[facility]-EMERGENCY DEPT Provider Note   CSN: [id number] Arrival date & time: 10/30/15  1940  By signing my name below, I, Stacy [name], attest that this documentation has been prepared under the direction and in the presence of Stacy Crease, MD  Electronically Signed: Clovis [name], ED Scribe. 10/30/15. 11:20 PM.    History   Chief Complaint Chief Complaint  Patient presents with  . Chest Pain     The history is provided by the patient. No language interpreter was used.   HPI Comments:  Stacy [name] is a 35 y.o. female who presents to the Emergency Department complaining of sudden onset, gradual improving, sharp chest pain onset 6 hours ago. She notes that around 5:30 this evening she began to have anxiety like symptoms that include dizziness, tingling all over, shaking, and shortness of breath. Pt states she's having some trouble catching her breath. She reports that she has had similar episodes in the past without the chest pain. Pt also notes that she has been around her children who are currently sick. Pt denies any family hx of MI, DM, and high cholesterol. She is a smoker.   Past Medical History:  Diagnosis Date  . Alcoholism in recovery [facility])    Been in recovery since 2010  . Hx of chlamydia infection   . Hx of pyelonephritis   . No pertinent past medical history   . Normal pregnancy in third trimester 12/04/2013  . Scoliosis    mild  . Supervision of normal pregnancy 11/02/2010  . SVD (spontaneous vaginal delivery) 12/05/2013  . Vaginal Pap smear, abnormal     Patient Active Problem List   Diagnosis Date Noted  . Other normal pregnancy, not first 12/05/2013  . SVD (spontaneous vaginal delivery) 12/05/2013  . Normal pregnancy in third trimester 12/04/2013    Past Surgical History:  Procedure Laterality Date  . CHOLECYSTECTOMY  7 years ago  . ECTOPIC PREGNANCY SURGERY  2-3 years ago   just tube removal  . NO PAST SURGERIES    . SALPINGECTOMY  2008   right     OB History    Gravida Para Term Preterm AB Living   5 4 4  0 1 4   SAB TAB Ectopic Multiple Live Births   0 0 1 0 4       Home Medications    Prior to Admission medications   Medication Sig Start Date End Date Taking? Authorizing Provider  acetaminophen (TYLENOL) 160 MG/5ML solution Take 10.2-20.3 mLs (325-650 mg total) by mouth every 6 (six) hours as needed for moderate pain. 12/06/13   Stacy Rein, MD  ALPRAZolam (XANAX) 0.5 MG tablet Take 1 tablet (0.5 mg total) by mouth at bedtime as needed for anxiety. 10/30/15   Stacy Crease, MD  Aspirin-Acetaminophen-Caffeine [id number] MG PACK Take 1 Package by mouth daily as needed (headache).    Historical Provider, MD  calcium carbonate (TUMS - DOSED IN MG ELEMENTAL CALCIUM) 500 MG chewable tablet Chew 1 tablet by mouth 2 (two) times daily as needed for indigestion or heartburn.    Historical Provider, MD  ibuprofen (ADVIL,MOTRIN) 100 MG/5ML suspension Take 40 mLs (800 mg total) by mouth every 8 (eight) hours as needed. 12/06/13   Stacy Bovard-Stuckert, MD  oxyCODONE (ROXICODONE) 5 MG/5ML solution Take 5-10 mLs (5-10 mg total) by mouth every 6 (six) hours as needed for severe pain. 12/06/13   Stacy Bovard-Stuckert, MD  oxymetazoline (AFRIN) 0.05 % nasal spray Place 1 spray into both nostrils  daily as needed for congestion.    Historical Provider, MD  Prenatal Vit-Fe Fumarate-FA (PRENATAL MULTIVITAMIN) TABS tablet Take 1 tablet by mouth daily. 12/06/13   Stacy ReinJody Bovard-Stuckert, MD    Family History Family History  Problem Relation Age of Onset  . Diabetes Father   . Depression Paternal Grandfather     Social History Social History  Substance Use Topics  . Smoking status: Current Every Day Smoker    Packs/day: 0.50    Years: 15.00    Types: Cigarettes  . Smokeless tobacco: Never Used  . Alcohol use Yes     Comment: occasional     Allergies   Review of patient's allergies indicates no known allergies.   Review  of Systems Review of Systems  Cardiovascular: Positive for chest pain.  Neurological: Positive for dizziness.  All other systems reviewed and are negative.    Physical Exam Updated Vital Signs BP 113/70   Pulse (!) 55   Temp 98 F (36.7 C) (Oral)   Resp 17   LMP 09/29/2015   SpO2 100%   Physical Exam  Constitutional: She is oriented to person, place, and time. She appears well-developed and well-nourished. No distress.  HENT:  Head: Normocephalic and atraumatic.  Right Ear: Hearing normal.  Left Ear: Hearing normal.  Nose: Nose normal.  Mouth/Throat: Oropharynx is clear and moist and mucous membranes are normal.  Eyes: Conjunctivae and EOM are normal. Pupils are equal, round, and reactive to light.  Neck: Normal range of motion. Neck supple.  Cardiovascular: Regular rhythm, S1 normal and S2 normal.  Exam reveals no gallop and no friction rub.   No murmur heard. Pulmonary/Chest: Effort normal and breath sounds normal. No respiratory distress. She exhibits no tenderness.  Abdominal: Soft. Normal appearance and bowel sounds are normal. There is no hepatosplenomegaly. There is no tenderness. There is no rebound, no guarding, no tenderness at McBurney's point and negative Murphy's sign. No hernia.  Musculoskeletal: Normal range of motion.  Neurological: She is alert and oriented to person, place, and time. She has normal strength. No cranial nerve deficit or sensory deficit. Coordination normal. GCS eye subscore is 4. GCS verbal subscore is 5. GCS motor subscore is 6.  Skin: Skin is warm, dry and intact. No rash noted. No cyanosis.  Psychiatric: She has a normal mood and affect. Her speech is normal and behavior is normal. Thought content normal.  Tearful and anxious  Nursing note and vitals reviewed.    ED Treatments / Results  DIAGNOSTIC STUDIES:  Oxygen Saturation is 100% on room air, normal by my interpretation.    COORDINATION OF CARE:  11:16 PM Discussed treatment  plan with pt at bedside and pt agreed to plan.  Labs (all labs ordered are listed, but only abnormal results are displayed) Labs Reviewed - No data to display  EKG  EKG Interpretation  Date/Time:  Wednesday October 30 2015 19:49:49 EDT Ventricular Rate:  89 PR Interval:  138 QRS Duration: 92 QT Interval:  378 QTC Calculation: 459 R Axis:   106 Text Interpretation:  Normal sinus rhythm Rightward axis Otherwise within normal limits Confirmed by Brynn Mulgrew  MD, Georga Stys [phone]([id number]) on 10/30/2015 11:02:19 PM       Radiology Dg Chest 2 View  Result Date: 10/30/2015 CLINICAL DATA:  35 year old female with dizziness and lower chest pain EXAM: CHEST  2 VIEW COMPARISON:  None. FINDINGS: The heart size and mediastinal contours are within normal limits. Both lungs are clear. The visualized skeletal structures are  unremarkable. IMPRESSION: No active cardiopulmonary disease. Electronically Signed   By: Elgie Collard M.D.   On: 10/30/2015 20:26    Procedures Procedures (including critical care time)  Medications Ordered in ED Medications - No data to display   Initial Impression / Assessment and Plan / ED Course  I have reviewed the triage vital signs and the nursing notes.  Pertinent labs & imaging results that were available during my care of the patient were reviewed by me and considered in my medical decision making (see chart for details).  Clinical Course    Patient presented to the emergency part with complaints of chest pain in the setting of anxiety. Patient reports that she has had episodes of anxiety in the past but has never been treated. She reports that she has been under a lot of stress at home recently. Tonight she started to have severe anxiety and then started to feel "scared". She had racing heartbeat, shortness of breath, dizziness, numbness and tingling in extremities. She has no cardiac risk factors other than cigarette smoking. Vital signs are normal, no tachycardia  or hypoxia, low risk Wells, neg PERC.  Patient feeling better without intervention. She is still very anxious and tearful while discussing things here in the ER, however. Will give limited supply of Xanax to be used as a rescue for future episodes of panic attack and gave resources for outpatient follow-up.  Final Clinical Impressions(s) / ED Diagnoses   Final diagnoses:  Atypical chest pain  Anxiety    New Prescriptions New Prescriptions   ALPRAZOLAM (XANAX) 0.5 MG TABLET    Take 1 tablet (0.5 mg total) by mouth at bedtime as needed for anxiety.  I personally performed the services described in this documentation, which was scribed in my presence. The recorded information has been reviewed and is accurate.     Stacy Crease, MD 10/30/15 [phone]

## 2015-10-30 NOTE — ED Triage Notes (Signed)
The pt is c/o chest pain since 1730 today  With dizziness.  She has a history of anxiety but this episode has lasted longer than usual she is tearful in triage and c/o being scared  lmp last month

## 2015-10-30 NOTE — ED Notes (Signed)
Pt departed in NAD, refused use of wheelchair.  

## 2016-07-11 ENCOUNTER — Emergency Department (HOSPITAL_COMMUNITY): Payer: Medicaid Other

## 2016-07-11 ENCOUNTER — Emergency Department (HOSPITAL_COMMUNITY)
Admission: EM | Admit: 2016-07-11 | Discharge: 2016-07-11 | Disposition: A | Discharge status: Home / Self Care | Payer: Medicaid Other | Attending: Emergency Medicine | Admitting: Emergency Medicine

## 2016-07-11 ENCOUNTER — Encounter (HOSPITAL_COMMUNITY): Payer: Self-pay | Admitting: Emergency Medicine

## 2016-07-11 DIAGNOSIS — F17210 Nicotine dependence, cigarettes, uncomplicated: Secondary | ICD-10-CM | POA: Diagnosis not present

## 2016-07-11 DIAGNOSIS — M545 Low back pain, unspecified: Secondary | ICD-10-CM

## 2016-07-11 DIAGNOSIS — R1032 Left lower quadrant pain: Secondary | ICD-10-CM

## 2016-07-11 DIAGNOSIS — Z7982 Long term (current) use of aspirin: Secondary | ICD-10-CM | POA: Insufficient documentation

## 2016-07-11 DIAGNOSIS — N83202 Unspecified ovarian cyst, left side: Secondary | ICD-10-CM | POA: Diagnosis not present

## 2016-07-11 LAB — COMPREHENSIVE METABOLIC PANEL
ALK PHOS: 84 U/L (ref 38–126)
ALT: 15 U/L (ref 14–54)
ANION GAP: 9 (ref 5–15)
AST: 18 U/L (ref 15–41)
Albumin: 4.4 g/dL (ref 3.5–5.0)
BILIRUBIN TOTAL: 0.7 mg/dL (ref 0.3–1.2)
BUN: 11 mg/dL (ref 6–20)
CALCIUM: 8.9 mg/dL (ref 8.9–10.3)
CO2: 21 mmol/L — ABNORMAL LOW (ref 22–32)
CREATININE: 0.87 mg/dL (ref 0.44–1.00)
Chloride: 104 mmol/L (ref 101–111)
GFR calc non Af Amer: >60 mL/min (ref >60)
GLUCOSE: 103 mg/dL — AB (ref 65–99)
Potassium: 3.8 mmol/L (ref 3.5–5.1)
Sodium: 134 mmol/L — ABNORMAL LOW (ref 135–145)
TOTAL PROTEIN: 7.4 g/dL (ref 6.5–8.1)

## 2016-07-11 LAB — URINALYSIS, ROUTINE W REFLEX MICROSCOPIC
BILIRUBIN URINE: NEGATIVE
GLUCOSE, UA: NEGATIVE mg/dL
Ketones, ur: NEGATIVE mg/dL
LEUKOCYTES UA: NEGATIVE
NITRITE: NEGATIVE
PH: 5 (ref 5.0–8.0)
Protein, ur: NEGATIVE mg/dL
SPECIFIC GRAVITY, URINE: 1.014 (ref 1.005–1.030)

## 2016-07-11 LAB — CBC WITH DIFFERENTIAL/PLATELET
BASOS ABS: 0 10*3/uL (ref 0.0–0.1)
BASOS PCT: 0 %
EOS ABS: 0 10*3/uL (ref 0.0–0.7)
Eosinophils Relative: 0 %
HEMATOCRIT: 44 % (ref 36.0–46.0)
Hemoglobin: 15.2 g/dL — ABNORMAL HIGH (ref 12.0–15.0)
Lymphocytes Relative: 5 %
Lymphs Abs: 0.7 10*3/uL (ref 0.7–4.0)
MCH: 30.4 pg (ref 26.0–34.0)
MCHC: 34.5 g/dL (ref 30.0–36.0)
MCV: 88 fL (ref 78.0–100.0)
MONO ABS: 0.2 10*3/uL (ref 0.1–1.0)
Monocytes Relative: 1 %
NEUTROS ABS: 12.1 10*3/uL — AB (ref 1.7–7.7)
NEUTROS PCT: 94 %
Platelets: 188 10*3/uL (ref 150–400)
RBC: 5 MIL/uL (ref 3.87–5.11)
RDW: 13 % (ref 11.5–15.5)
WBC: 12.9 10*3/uL — ABNORMAL HIGH (ref 4.0–10.5)

## 2016-07-11 LAB — I-STAT BETA HCG BLOOD, ED (MC, WL, AP ONLY)

## 2016-07-11 LAB — I-STAT CG4 LACTIC ACID, ED: Lactic Acid, Venous: 0.44 mmol/L — ABNORMAL LOW (ref 0.5–1.9)

## 2016-07-11 MED ORDER — ONDANSETRON HCL 4 MG/2ML IJ SOLN
4.0000 mg | Freq: Once | INTRAMUSCULAR | Status: AC
  Administered 2016-07-11: 4 mg via INTRAVENOUS
  Filled 2016-07-11: qty 2

## 2016-07-11 MED ORDER — SODIUM CHLORIDE 0.9 % IV BOLUS (SEPSIS)
1000.0000 mL | Freq: Once | INTRAVENOUS | Status: AC
  Administered 2016-07-11: 1000 mL via INTRAVENOUS

## 2016-07-11 MED ORDER — IOPAMIDOL (ISOVUE-300) INJECTION 61%
INTRAVENOUS | Status: AC
  Administered 2016-07-11: 100 mL
  Filled 2016-07-11: qty 100

## 2016-07-11 MED ORDER — TRAMADOL HCL 50 MG PO TABS: 50.0000 mg | ORAL_TABLET | Freq: Four times a day (QID) | ORAL | 0 refills | Status: DC | PRN

## 2016-07-11 MED ORDER — IBUPROFEN 800 MG PO TABS: 800.0000 mg | ORAL_TABLET | Freq: Three times a day (TID) | ORAL | 0 refills | Status: AC | PRN

## 2016-07-11 MED ORDER — IBUPROFEN 800 MG PO TABS
800.0000 mg | ORAL_TABLET | Freq: Once | ORAL | Status: AC
  Administered 2016-07-11: 800 mg via ORAL
  Filled 2016-07-11: qty 1

## 2016-07-11 MED ORDER — MORPHINE SULFATE (PF) 4 MG/ML IV SOLN
4.0000 mg | Freq: Once | INTRAVENOUS | Status: AC
  Administered 2016-07-11: 4 mg via INTRAVENOUS
  Filled 2016-07-11: qty 1

## 2016-07-11 MED ORDER — PROMETHAZINE HCL 25 MG PO TABS: 25.0000 mg | ORAL_TABLET | Freq: Three times a day (TID) | ORAL | 0 refills | Status: DC | PRN

## 2016-07-11 NOTE — Discharge Instructions (Signed)
Return here as needed follow up with the clinic provided

## 2016-07-11 NOTE — ED Provider Notes (Signed)
[facility]-EMERGENCY DEPT Provider Note   CSN: [id number] Arrival date & time: 07/11/16  1206     History   Chief Complaint Chief Complaint  Patient presents with  . Generalized Body Aches  . Emesis    HPI Stacy [name] is a 36 y.o. female.  Patient with history of previous cholecystectomy presents with complaint of vomiting and diarrhea starting at midnight this morning. Patient states that she was recently menstruating and had back pain; this ended yesterday. However back pain remained. Her worst pain is currently across her lower back. Patient has been having chills, no documented fever. Vomiting was persistent through the morning, patient reports green vomit. No blood. She had a small amount of soft, nonbloody stools. Patient also c/o pain localized to her left lower quadrant. No vaginal bleeding or discharge. No treatments prior to arrival. The onset of this condition was acute. The course is constant. Aggravating factors: none. Alleviating factors: none.        Past Medical History:  Diagnosis Date  . Alcoholism in recovery [facility])    Been in recovery since 2010  . Hx of chlamydia infection   . Hx of pyelonephritis   . No pertinent past medical history   . Normal pregnancy in third trimester 12/04/2013  . Scoliosis    mild  . Supervision of normal pregnancy 11/02/2010  . SVD (spontaneous vaginal delivery) 12/05/2013  . Vaginal Pap smear, abnormal     Patient Active Problem List   Diagnosis Date Noted  . Other normal pregnancy, not first 12/05/2013  . SVD (spontaneous vaginal delivery) 12/05/2013  . Normal pregnancy in third trimester 12/04/2013    Past Surgical History:  Procedure Laterality Date  . CHOLECYSTECTOMY  7 years ago  . ECTOPIC PREGNANCY SURGERY  2-3 years ago   just tube removal  . NO PAST SURGERIES    . SALPINGECTOMY  2008   right    OB History    Gravida Para Term Preterm AB Living   5 4 4  0 1 4   SAB TAB Ectopic Multiple Live Births   0 0 1 0  4       Home Medications    Prior to Admission medications   Medication Sig Start Date End Date Taking? Authorizing Provider  acetaminophen (TYLENOL) 160 MG/5ML solution Take 10.2-20.3 mLs (325-650 mg total) by mouth every 6 (six) hours as needed for moderate pain. 12/06/13   Bovard-Stuckert, Augusto Gamble, MD  ALPRAZolam Prudy Feeler) 0.5 MG tablet Take 1 tablet (0.5 mg total) by mouth at bedtime as needed for anxiety. 10/30/15   [name], MD  Aspirin-Acetaminophen-Caffeine [phone] MG PACK Take 1 Package by mouth daily as needed (headache).    [provider]  calcium carbonate (TUMS - DOSED IN MG ELEMENTAL CALCIUM) 500 MG chewable tablet Chew 1 tablet by mouth 2 (two) times daily as needed for indigestion or heartburn.    [provider]  ibuprofen (ADVIL,MOTRIN) 100 MG/5ML suspension Take 40 mLs (800 mg total) by mouth every 8 (eight) hours as needed. 12/06/13   Bovard-Stuckert, Augusto Gamble, MD  oxyCODONE (ROXICODONE) 5 MG/5ML solution Take 5-10 mLs (5-10 mg total) by mouth every 6 (six) hours as needed for severe pain. 12/06/13   Bovard-Stuckert, Augusto Gamble, MD  oxymetazoline (AFRIN) 0.05 % nasal spray Place 1 spray into both nostrils daily as needed for congestion.    [provider]  Prenatal Vit-Fe Fumarate-FA (PRENATAL MULTIVITAMIN) TABS tablet Take 1 tablet by mouth daily. 12/06/13   Bovard-Stuckert, Jody,  MD    Family History Family History  Problem Relation Age of Onset  . Diabetes Father   . Depression Paternal Grandfather     Social History Social History  Substance Use Topics  . Smoking status: Current Every Day Smoker    Packs/day: 0.50    Years: 15.00    Types: Cigarettes  . Smokeless tobacco: Never Used  . Alcohol use Yes     Comment: occasional     Allergies   Patient has no known allergies.   Review of Systems Review of Systems  Constitutional: Positive for chills. Negative for fever.  HENT: Negative for rhinorrhea and sore throat.     Eyes: Negative for redness.  Respiratory: Negative for cough.   Cardiovascular: Negative for chest pain.  Gastrointestinal: Positive for abdominal pain, diarrhea, nausea and vomiting. Negative for blood in stool.  Genitourinary: Negative for dysuria.  Musculoskeletal: Positive for back pain. Negative for myalgias.  Skin: Negative for rash.  Neurological: Negative for headaches.     Physical Exam Updated Vital Signs BP (S) 95/65 (BP Location: Right Arm)   Pulse (S) (!) 121   Temp (S) 99.7 F (37.6 C) (Oral)   Resp 18   Ht 5\' 7"  (1.702 m)   Wt 143 lb (64.9 kg)   LMP 07/05/2016 (Approximate)   SpO2 98%   BMI 22.40 kg/m   Physical Exam  Constitutional: She appears well-developed and well-nourished.  HENT:  Head: Normocephalic and atraumatic.  Mouth/Throat: Oropharynx is clear and moist.  Eyes: Conjunctivae are normal. Right eye exhibits no discharge. Left eye exhibits no discharge.  Neck: Normal range of motion. Neck supple.  Cardiovascular: Regular rhythm and normal heart sounds.   Mild tachycardia.  Pulmonary/Chest: Effort normal and breath sounds normal. No respiratory distress. She has no wheezes. She has no rales.  Abdominal: Soft. There is tenderness. There is no rebound and no guarding.  Patient winces in pain with palpation of the left lower quadrant. No right lower quadrant pain.  Musculoskeletal: Normal range of motion.  Tenderness to palpation across the lower back, left greater than right.  Neurological: She is alert.  Skin: Skin is warm and dry.  Psychiatric: She has a normal mood and affect.  Nursing note and vitals reviewed.    ED Treatments / Results  Labs (all labs ordered are listed, but only abnormal results are displayed) Labs Reviewed  COMPREHENSIVE METABOLIC PANEL - Abnormal; Notable for the following:       Result Value   Sodium 134 (*)    CO2 21 (*)    Glucose, Bld 103 (*)    All other components within normal limits  CBC WITH  DIFFERENTIAL/PLATELET - Abnormal; Notable for the following:    WBC 12.9 (*)    Hemoglobin 15.2 (*)    Neutro Abs 12.1 (*)    All other components within normal limits  URINALYSIS, ROUTINE W REFLEX MICROSCOPIC - Abnormal; Notable for the following:    APPearance HAZY (*)    Hgb urine dipstick SMALL (*)    Bacteria, UA RARE (*)    Squamous Epithelial / LPF 0-5 (*)    All other components within normal limits  I-STAT CG4 LACTIC ACID, ED - Abnormal; Notable for the following:    Lactic Acid, Venous 0.44 (*)    All other components within normal limits  I-STAT BETA HCG BLOOD, ED ([facility], [facility], [facility] ONLY)  I-STAT CG4 LACTIC ACID, ED    Radiology Dg Chest 2 View  Result  Date: 07/11/2016 CLINICAL DATA:  Fever, body aches EXAM: CHEST  2 VIEW COMPARISON:  10/30/2015 FINDINGS: The heart size and mediastinal contours are within normal limits. Both lungs are clear. The visualized skeletal structures are unremarkable. IMPRESSION: No active cardiopulmonary disease. Electronically Signed   By: Charlett NoseKevin  Dover M.D.   On: 07/11/2016 12:53    Procedures Procedures (including critical care time)  Medications Ordered in ED Medications  iopamidol (ISOVUE-300) 61 % injection (not administered)  sodium chloride 0.9 % bolus 1,000 mL (1,000 mLs Intravenous New Bag/Given 07/11/16 1437)  morphine 4 MG/ML injection 4 mg (4 mg Intravenous Given 07/11/16 1438)  ondansetron (ZOFRAN) injection 4 mg (4 mg Intravenous Given 07/11/16 1437)     Initial Impression / Assessment and Plan / ED Course  I have reviewed the triage vital signs and the nursing notes.  Pertinent labs & imaging results that were available during my care of the patient were reviewed by me and considered in my medical decision making (see chart for details).     Patient seen and examined. Work-up initiated. Given focal tenderness, reported bilious vomiting, will obtain CT abdomen and pelvis to rule out any intra-abdominal or serious pelvic etiology.  Pregnancy test pending. Medications ordered.   Vital signs reviewed and are as follows: BP 110/66 (BP Location: Right Arm)   Pulse 97   Temp (S) 99.7 F (37.6 C) (Oral)   Resp 18   Ht 5\' 7"  (1.702 m)   Wt 143 lb (64.9 kg)   LMP 07/05/2016 (Approximate)   SpO2 100%   BMI 22.40 kg/m   4:12 PM Patient rechecked. Pain controlled.   Handoff to Lawyer PA-C at shift change.   Plan: Follow-up on CT results. If neg, home with symptom control.   Final Clinical Impressions(s) / ED Diagnoses   Final diagnoses:  Abdominal pain, acute, left lower quadrant  Acute left-sided low back pain without sciatica   Pending CT.   New Prescriptions New Prescriptions   No medications on file     Renne CriglerGeiple, Estiben Mizuno, Cordelia Poche-C 07/11/16 1614    Mancel BaleWentz, Elliott, MD 07/11/16 [phone]

## 2016-07-11 NOTE — ED Triage Notes (Signed)
Started having body aches, chills, back pain, vomiting last night, diarrhea started this am. Has not been around anyone that has been sick .

## 2016-07-29 ENCOUNTER — Encounter (HOSPITAL_COMMUNITY): Payer: Self-pay

## 2016-07-29 ENCOUNTER — Emergency Department (HOSPITAL_COMMUNITY)
Admission: EM | Admit: 2016-07-29 | Discharge: 2016-07-30 | Disposition: A | Discharge status: Home / Self Care | Payer: Medicaid Other | Attending: Emergency Medicine | Admitting: Emergency Medicine

## 2016-07-29 ENCOUNTER — Other Ambulatory Visit: Payer: Self-pay

## 2016-07-29 DIAGNOSIS — Z79899 Other long term (current) drug therapy: Secondary | ICD-10-CM | POA: Diagnosis not present

## 2016-07-29 DIAGNOSIS — R42 Dizziness and giddiness: Secondary | ICD-10-CM | POA: Diagnosis not present

## 2016-07-29 DIAGNOSIS — Z7982 Long term (current) use of aspirin: Secondary | ICD-10-CM | POA: Insufficient documentation

## 2016-07-29 DIAGNOSIS — F17210 Nicotine dependence, cigarettes, uncomplicated: Secondary | ICD-10-CM | POA: Insufficient documentation

## 2016-07-29 DIAGNOSIS — H6123 Impacted cerumen, bilateral: Secondary | ICD-10-CM

## 2016-07-29 LAB — CBG MONITORING, ED: GLUCOSE-CAPILLARY: 98 mg/dL (ref 65–99)

## 2016-07-29 LAB — BASIC METABOLIC PANEL
ANION GAP: 8 (ref 5–15)
BUN: 11 mg/dL (ref 6–20)
CALCIUM: 9.1 mg/dL (ref 8.9–10.3)
CO2: 26 mmol/L (ref 22–32)
Chloride: 106 mmol/L (ref 101–111)
Creatinine, Ser: 0.88 mg/dL (ref 0.44–1.00)
GFR calc Af Amer: >60 mL/min (ref >60)
Glucose, Bld: 98 mg/dL (ref 65–99)
POTASSIUM: 3.7 mmol/L (ref 3.5–5.1)
SODIUM: 140 mmol/L (ref 135–145)

## 2016-07-29 LAB — I-STAT BETA HCG BLOOD, ED (MC, WL, AP ONLY)

## 2016-07-29 LAB — CBC
HEMATOCRIT: 39.8 % (ref 36.0–46.0)
Hemoglobin: 13.5 g/dL (ref 12.0–15.0)
MCH: 29.2 pg (ref 26.0–34.0)
MCHC: 33.9 g/dL (ref 30.0–36.0)
MCV: 86.1 fL (ref 78.0–100.0)
Platelets: 227 10*3/uL (ref 150–400)
RBC: 4.62 MIL/uL (ref 3.87–5.11)
RDW: 12.9 % (ref 11.5–15.5)
WBC: 7.6 10*3/uL (ref 4.0–10.5)

## 2016-07-29 MED ORDER — SODIUM CHLORIDE 0.9 % IV BOLUS (SEPSIS)
1000.0000 mL | Freq: Once | INTRAVENOUS | Status: AC
  Administered 2016-07-30: 1000 mL via INTRAVENOUS

## 2016-07-29 MED ORDER — ONDANSETRON HCL 4 MG/2ML IJ SOLN
4.0000 mg | Freq: Once | INTRAMUSCULAR | Status: AC
  Administered 2016-07-30: 4 mg via INTRAVENOUS
  Filled 2016-07-29: qty 2

## 2016-07-29 MED ORDER — DOCUSATE SODIUM 50 MG/5ML PO LIQD
50.0000 mg | Freq: Once | ORAL | Status: DC
  Filled 2016-07-29: qty 10

## 2016-07-29 MED ORDER — MECLIZINE HCL 25 MG PO TABS
25.0000 mg | ORAL_TABLET | Freq: Once | ORAL | Status: AC
  Administered 2016-07-30: 25 mg via ORAL
  Filled 2016-07-29: qty 1

## 2016-07-29 NOTE — ED Provider Notes (Signed)
By signing my name below, I, Modena Jansky, attest that this documentation has been prepared under the direction and in the presence of Ward, Layla Maw, DO. Electronically Signed: Modena Jansky, Scribe. 07/29/2016. 11:25 PM.  TIME SEEN: 11:25 PM  CHIEF COMPLAINT: Dizziness  HPI:  HPI Comments: Stacy [name] is a 36 y.o. female who presents to the Emergency Department complaining of intermittent room-spinning dizziness that started today. She had been feeling intermittently dizziness all day at work tonight and it suddenly worsened after getting a soda. States she felt like she might pass out. Denies that this was a lightheadedness.  Her dizziness is relieved by laying down and exacerbated by sitting up from laying down. She reports associated mild diffuse headache, vomiting (once today), and left ear hearing difficulty (somewhat present now). She reports that she has been getting water in her left ear during the week and feels there is earwax stuck. She is currently on her menstrual cycle. Denies any hx of similar complaint, ear pain, current tinnitus, chest pain, SOB, blood in stool, melena, numbness/tingling, weakness, or other complaints at this time.   ROS: See HPI Constitutional: no fever  Eyes: no drainage  ENT: hearing difficulty (left ear), no ear pain, no runny nose   Cardiovascular:  no chest pain  Resp: no SOB  GI: +vomiting (once), no blood in stool GU: no dysuria Integumentary: no rash  Allergy: no hives  Musculoskeletal: no leg swelling  Neurological: +dizziness, +headache, no slurred speech, no numbness, no weakness ROS otherwise negative  PAST MEDICAL HISTORY/PAST SURGICAL HISTORY:  Past Medical History:  Diagnosis Date  . Alcoholism in recovery [facility])    Been in recovery since 2010  . Hx of chlamydia infection   . Hx of pyelonephritis   . No pertinent past medical history   . Normal pregnancy in third trimester 12/04/2013  . Scoliosis    mild  . Supervision of  normal pregnancy 11/02/2010  . SVD (spontaneous vaginal delivery) 12/05/2013  . Vaginal Pap smear, abnormal     MEDICATIONS:  Prior to Admission medications   Medication Sig Start Date End Date Taking? Authorizing Provider  ALPRAZolam Prudy Feeler) 0.5 MG tablet Take 1 tablet (0.5 mg total) by mouth at bedtime as needed for anxiety. 10/30/15  Yes Pollina, Canary Brim, MD  busPIRone (BUSPAR) 10 MG tablet Take 10 mg by mouth 2 (two) times daily.   Yes [provider]  oxymetazoline (AFRIN) 0.05 % nasal spray Place 1 spray into both nostrils daily as needed for congestion.   Yes [provider]  traMADol (ULTRAM) 50 MG tablet Take 1 tablet (50 mg total) by mouth every 6 (six) hours as needed for severe pain. 07/11/16  Yes Charlestine Night, PA-C  Aspirin-Acetaminophen-Caffeine [phone] MG PACK Take 1 Package by mouth daily as needed (headache).    [provider]  calcium carbonate (TUMS - DOSED IN MG ELEMENTAL CALCIUM) 500 MG chewable tablet Chew 1 tablet by mouth 2 (two) times daily as needed for indigestion or heartburn.    [provider]  ibuprofen (ADVIL,MOTRIN) 800 MG tablet Take 1 tablet (800 mg total) by mouth every 8 (eight) hours as needed. 07/11/16   Lawyer, Cristal Deer, PA-C  promethazine (PHENERGAN) 25 MG tablet Take 1 tablet (25 mg total) by mouth every 8 (eight) hours as needed for nausea or vomiting. 07/11/16   Lawyer, Cristal Deer, PA-C    ALLERGIES:  No Known Allergies  SOCIAL HISTORY:  Social History  Substance Use Topics  . Smoking  status: Current Every Day Smoker    Packs/day: 0.50    Years: 15.00    Types: Cigarettes  . Smokeless tobacco: Never Used  . Alcohol use Yes     Comment: occasional    FAMILY HISTORY: Family History  Problem Relation Age of Onset  . Diabetes Father   . Depression Paternal Grandfather     EXAM: BP 121/76   Pulse 71   Temp 98.3 F (36.8 C)   Resp 19   LMP 07/05/2016   SpO2 99%  CONSTITUTIONAL:  Alert and oriented and responds appropriately to questions. Well-appearing; well-nourished HEAD: Normocephalic EYES: Conjunctivae clear, pupils appear equal, EOMI ENT: normal nose; moist mucous membranes; No pharyngeal erythema or petechiae, no tonsillar hypertrophy or exudate, no uvular deviation, no unilateral swelling, no trismus or drooling, no muffled voice, normal phonation, no stridor, no dental caries present, no drainable dental abscess noted, no Ludwig's angina, tongue sits flat in the bottom of the mouth, no angioedema, no facial erythema or warmth, no facial swelling; no pain with movement of the neck.  Patient has cerumen impaction bilaterally. Unable to visualize the tympanic membranes. No signs of mastoiditis. No pain with manipulation of the pinna bilaterally. NECK: Supple, no meningismus, no nuchal rigidity, no LAD  CARD: RRR; S1 and S2 appreciated; no murmurs, no clicks, no rubs, no gallops RESP: Normal chest excursion without splinting or tachypnea; breath sounds clear and equal bilaterally; no wheezes, no rhonchi, no rales, no hypoxia or respiratory distress, speaking full sentences ABD/GI: Normal bowel sounds; non-distended; soft, non-tender, no rebound, no guarding, no peritoneal signs, no hepatosplenomegaly BACK:  The back appears normal and is non-tender to palpation, there is no CVA tenderness EXT: Normal ROM in all joints; non-tender to palpation; no edema; normal capillary refill; no cyanosis, no calf tenderness or swelling    SKIN: Normal color for age and race; warm; no rash NEURO: Moves all extremities equally, sensation to light touch intact diffusely, cranial nerves II through XII intact, normal speech, no dysmetria to finger-nose testing bilaterally, no discitis on exam PSYCH: The patient's mood and manner are appropriate. Grooming and personal hygiene are appropriate.  MEDICAL DECISION MAKING: Patient here with vertigo. Likely peripheral in nature. Doubt stroke. We'll  treat her symptoms with IV fluids, meclizine and Zofran. Labs ordered in triage are pending.  Pt will need bilateral cerumen removal.  ED PROGRESS: Patient has had some improvement after meclizine, IV fluids and Zofran. We will give dose of oral Valium and then attempt to ambulate patient.  On reevaluation, patient's TMs are clear bilaterally without erythema, purulence, bulging, perforation, effusion.  No cerumen impaction or sign of foreign body in the external auditory canal. No inflammation drainage from the external auditory canal. Mild erythema noted to both external auditory canals from irrigation and removal of cerumen. No sign of otitis externa, otitis media.  She reports feeling much better and is able to ambulate without any assistance or difficulty. Vertigo has them is completely resolved. She states she is able to hear much better as well. She states she does not use Q-tips at all but has had problems with cerumen impaction before. We'll discharge with Valium, Zofran to take as needed if symptoms return. Recommended PCP follow-up as needed. Provided with work note. Discussed return precautions. Again doubt stroke, and cranial hemorrhage, cavernous sinus thrombosis. I feel she is safe to be discharged home with her husband.  At this time, I do not feel there is any life-threatening condition present. I  have reviewed and discussed all results (EKG, imaging, lab, urine as appropriate) and exam findings with patient/family. I have reviewed nursing notes and appropriate previous records.  I feel the patient is safe to be discharged home without further emergent workup and can continue workup as an outpatient as needed. Discussed usual and customary return precautions. Patient/family verbalize understanding and are comfortable with this plan.  Outpatient follow-up has been provided if needed. All questions have been answered.    Date: 07/29/2016 22:44  Rate: 72  Rhythm: normal sinus rhythm  QRS  Axis: normal  Intervals: normal  ST/T Wave abnormalities: normal  Conduction Disutrbances: none  Narrative Interpretation: unremarkable      .Ear Cerumen Removal Date/Time: 07/30/2016 6:52 AM Performed by: Raelyn NumberWARD, KRISTEN N Authorized by: Raelyn NumberWARD, KRISTEN N   Consent:    Consent obtained:  Verbal   Consent given by:  Patient   Risks discussed:  Dizziness, bleeding, infection, incomplete removal, pain and TM perforation   Alternatives discussed:  No treatment Procedure details:    Location:  L ear and R ear   Procedure type: irrigation   Post-procedure details:    Inspection:  TM intact   Hearing quality:  Improved   Patient tolerance of procedure:  Tolerated well, no immediate complications   I personally performed the services described in this documentation, which was scribed in my presence. The recorded information has been reviewed and is accurate.    Ward, Layla MawKristen N, DO 07/30/16 [phone]

## 2016-07-30 LAB — URINALYSIS, ROUTINE W REFLEX MICROSCOPIC
Bilirubin Urine: NEGATIVE
GLUCOSE, UA: NEGATIVE mg/dL
Ketones, ur: NEGATIVE mg/dL
Leukocytes, UA: NEGATIVE
NITRITE: NEGATIVE
PH: 7 (ref 5.0–8.0)
Protein, ur: NEGATIVE mg/dL
SPECIFIC GRAVITY, URINE: 1.009 (ref 1.005–1.030)

## 2016-07-30 MED ORDER — DIAZEPAM 5 MG PO TABS
5.0000 mg | ORAL_TABLET | Freq: Once | ORAL | Status: AC
  Administered 2016-07-30: 5 mg via ORAL
  Filled 2016-07-30: qty 1

## 2016-07-30 MED ORDER — DIAZEPAM 5 MG PO TABS: 5.0000 mg | ORAL_TABLET | Freq: Three times a day (TID) | ORAL | 0 refills | Status: DC | PRN

## 2016-07-30 MED ORDER — ONDANSETRON 4 MG PO TBDP: 4.0000 mg | ORAL_TABLET | Freq: Four times a day (QID) | ORAL | 0 refills | Status: DC | PRN

## 2016-07-30 NOTE — ED Notes (Signed)
Ambulated well independent w/steady gait

## 2016-07-30 NOTE — Discharge Instructions (Signed)
To find a primary care or specialty doctor please call [phone] or [phone] to access "[facility] Find a Doctor Service." ° °You may also go on the [facility] website at [email or web address] ° °There are also multiple [facility], [facility], [facility] and [facility] practices throughout the [place] that are frequently accepting new patients. You may find a clinic that is close to your home and contact them. ° °[facility] -  °[address] °[place] [address] °[phone] ° ° °[facility] -  °[address] °[place] [address] °[phone] ° ° °[facility] - °[address]  °[place] [address] °[phone] ° ° °

## 2016-07-30 NOTE — ED Notes (Signed)
Both ears cleaned, pt reports relief from dizziness and discomfort,

## 2016-09-21 ENCOUNTER — Emergency Department (HOSPITAL_COMMUNITY)
Admission: EM | Admit: 2016-09-21 | Discharge: 2016-09-21 | Discharge status: Against Medical Advice | Payer: Medicaid Other | Attending: Emergency Medicine | Admitting: Emergency Medicine

## 2016-09-21 ENCOUNTER — Encounter (HOSPITAL_COMMUNITY): Payer: Self-pay | Admitting: Emergency Medicine

## 2016-09-21 DIAGNOSIS — R1310 Dysphagia, unspecified: Secondary | ICD-10-CM | POA: Insufficient documentation

## 2016-09-21 DIAGNOSIS — Z5321 Procedure and treatment not carried out due to patient leaving prior to being seen by health care provider: Secondary | ICD-10-CM | POA: Insufficient documentation

## 2016-09-21 LAB — RAPID STREP SCREEN (MED CTR MEBANE ONLY): STREPTOCOCCUS, GROUP A SCREEN (DIRECT): NEGATIVE

## 2016-09-21 NOTE — ED Notes (Signed)
Pt up to desk requesting update, provided answers to questions and apologized for wait. Pt not willing to wait to be seen. LWBS.

## 2016-09-21 NOTE — ED Triage Notes (Addendum)
Pt reports that her "epiglotis feels like it is swollen and closing up on me."  Pt reports one episode of emesis when the feeling started but none since then.  No trouble breathing and is able to talk in complete sentences w/o difficulty.  Pt reports hx of anxiety.

## 2016-09-24 LAB — CULTURE, GROUP A STREP (THRC)

## 2016-09-25 ENCOUNTER — Ambulatory Visit (HOSPITAL_COMMUNITY)
Admission: EM | Admit: 2016-09-25 | Discharge: 2016-09-25 | Disposition: A | Discharge status: Home / Self Care | Payer: Medicaid Other | Attending: Emergency Medicine | Admitting: Emergency Medicine

## 2016-09-25 ENCOUNTER — Encounter (HOSPITAL_COMMUNITY): Payer: Self-pay | Admitting: *Deleted

## 2016-09-25 DIAGNOSIS — R0989 Other specified symptoms and signs involving the circulatory and respiratory systems: Secondary | ICD-10-CM

## 2016-09-25 DIAGNOSIS — F458 Other somatoform disorders: Secondary | ICD-10-CM | POA: Diagnosis not present

## 2016-09-25 MED ORDER — OMEPRAZOLE 40 MG PO CPDR: 40.0000 mg | DELAYED_RELEASE_CAPSULE | Freq: Every day | ORAL | 0 refills | Status: DC

## 2016-09-25 NOTE — ED Provider Notes (Signed)
  [facility]   [id number] 09/25/16 Arrival Time: 1658  ASSESSMENT & PLAN:  1. Globus sensation     Meds ordered this encounter  Medications  . omeprazole (PRILOSEC) 40 MG capsule    Sig: Take 1 capsule (40 mg total) by mouth daily.    Dispense:  30 capsule    Refill:  0    Order Specific Question:   Supervising Provider    Answer:   Domenick GongMORTENSON, ASHLEY [[id number]]    Reviewed expectations re: course of current medical issues. Questions answered. Outlined signs and symptoms indicating need for more acute intervention. Patient verbalized understanding. After Visit Summary given.   SUBJECTIVE:  Stacy [name] is a 36 y.o. female who presents with complaint of Sensation of something in her throat for 4 days denies any trauma, fever, sore throat, she is been able to eat and drink fine, does occasionally have reflux, is not on any medicine for reflux, does have a history of anxiety, no nausea, vomiting, or diarrhea or other systemic complaints  ROS: As per HPI.   OBJECTIVE:  Vitals:   09/25/16 1715  BP: 132/70  Pulse: 82  Resp: 18  Temp: 99.2 F (37.3 C)  TempSrc: Oral  SpO2: 100%     General appearance: alert; no distress, Appears stated age HEENT: normocephalic; atraumatic; conjunctivae normal; TMs normal; nasal mucosa normal; oral mucosa normal, no oropharyngeal edema or erythema or tonsillar exudate Neck: supple, no cervical lymphadenopathy, thyromegaly, or other abnormal findings Lungs: clear to auscultation bilaterally Heart: regular rate and rhythm Abdomen: soft, non-tender; bowel sounds normal; no masses or organomegaly; no guarding or rebound tenderness Back: no CVA tenderness Extremities: no cyanosis or edema; symmetrical with no gross deformities Skin: warm and dry Neurologic: Grossly normal Psychological:  alert and cooperative; normal mood and affect  Results for orders placed or performed during the hospital encounter of 09/21/16  Rapid strep  screen  Result Value Ref Range   Streptococcus, Group A Screen (Direct) NEGATIVE NEGATIVE  Culture, group A strep  Result Value Ref Range   Specimen Description THROAT    Special Requests NONE Reflexed from [id number]    Culture NO GROUP A STREP (S.PYOGENES) ISOLATED    Report Status 09/24/2016 FINAL     Labs Reviewed - No data to display  No results found.  No Known Allergies  PMHx, SurgHx, SocialHx, Medications, and Allergies were reviewed in the Visit Navigator and updated as appropriate.      Dorena BodoKennard, Lebaron Bautch, NP 09/25/16 1750

## 2016-09-25 NOTE — Discharge Instructions (Signed)
The symptoms here having called a "globus sensation" majority of the time these heal on their own without intervention. I will start you on a short course of a proton pump inhibitor to prevent reflux and to allow the throat to heal. Eat a soft diet, and if symptoms persist past 2 weeks, follow up with a GI provider

## 2016-09-25 NOTE — ED Triage Notes (Signed)
Pt  Reports    Sensation of  Something  In  Her  Throat   And    When  She  Swallows    No   sorethroat        No  Fever  Denies   Any  Other      Symptoms  No  Known    fb  No  Hoarseness      Symptoms  Started   3  Days  Ago

## 2016-12-16 ENCOUNTER — Encounter (HOSPITAL_COMMUNITY): Payer: Self-pay | Admitting: *Deleted

## 2016-12-16 ENCOUNTER — Emergency Department (HOSPITAL_COMMUNITY): Payer: Medicaid Other

## 2016-12-16 DIAGNOSIS — Z79899 Other long term (current) drug therapy: Secondary | ICD-10-CM | POA: Diagnosis not present

## 2016-12-16 DIAGNOSIS — J181 Lobar pneumonia, unspecified organism: Secondary | ICD-10-CM | POA: Diagnosis not present

## 2016-12-16 DIAGNOSIS — R05 Cough: Secondary | ICD-10-CM | POA: Diagnosis present

## 2016-12-16 DIAGNOSIS — F17210 Nicotine dependence, cigarettes, uncomplicated: Secondary | ICD-10-CM | POA: Diagnosis not present

## 2016-12-16 LAB — CBC
HCT: 37.4 % (ref 36.0–46.0)
Hemoglobin: 13.2 g/dL (ref 12.0–15.0)
MCH: 30.3 pg (ref 26.0–34.0)
MCHC: 35.3 g/dL (ref 30.0–36.0)
MCV: 86 fL (ref 78.0–100.0)
Platelets: 247 10*3/uL (ref 150–400)
RBC: 4.35 MIL/uL (ref 3.87–5.11)
RDW: 12.7 % (ref 11.5–15.5)
WBC: 13.6 10*3/uL — AB (ref 4.0–10.5)

## 2016-12-16 LAB — BASIC METABOLIC PANEL
Anion gap: 9 (ref 5–15)
BUN: 5 mg/dL — AB (ref 6–20)
CALCIUM: 9.5 mg/dL (ref 8.9–10.3)
CO2: 24 mmol/L (ref 22–32)
Chloride: 101 mmol/L (ref 101–111)
Creatinine, Ser: 0.59 mg/dL (ref 0.44–1.00)
GFR calc Af Amer: >60 mL/min (ref >60)
GLUCOSE: 100 mg/dL — AB (ref 65–99)
Potassium: 3.7 mmol/L (ref 3.5–5.1)
SODIUM: 134 mmol/L — AB (ref 135–145)

## 2016-12-16 LAB — I-STAT TROPONIN, ED: TROPONIN I, POC: 0.01 ng/mL (ref 0.00–0.08)

## 2016-12-16 NOTE — ED Triage Notes (Signed)
Pt has had congestion for the past two weeks. Tonight around 6pm pt felt a sharp pain in the center chest that radiated around both breasts. Pt took mediation tonight for anxiety. Reports worsening sob tonight as well

## 2016-12-17 ENCOUNTER — Emergency Department (HOSPITAL_COMMUNITY)
Admission: EM | Admit: 2016-12-17 | Discharge: 2016-12-17 | Disposition: A | Discharge status: Home / Self Care | Payer: Medicaid Other | Attending: Emergency Medicine | Admitting: Emergency Medicine

## 2016-12-17 ENCOUNTER — Other Ambulatory Visit: Payer: Self-pay

## 2016-12-17 DIAGNOSIS — J189 Pneumonia, unspecified organism: Secondary | ICD-10-CM

## 2016-12-17 DIAGNOSIS — J181 Lobar pneumonia, unspecified organism: Secondary | ICD-10-CM

## 2016-12-17 MED ORDER — LEVOFLOXACIN 500 MG PO TABS: 500.0000 mg | ORAL_TABLET | Freq: Every day | ORAL | 0 refills | Status: DC

## 2016-12-17 MED ORDER — IBUPROFEN 800 MG PO TABS
800.0000 mg | ORAL_TABLET | Freq: Once | ORAL | Status: AC
  Administered 2016-12-17: 800 mg via ORAL
  Filled 2016-12-17: qty 1

## 2016-12-17 MED ORDER — LEVOFLOXACIN 750 MG PO TABS
750.0000 mg | ORAL_TABLET | Freq: Once | ORAL | Status: AC
  Administered 2016-12-17: 750 mg via ORAL
  Filled 2016-12-17: qty 1

## 2016-12-17 MED ORDER — GUAIFENESIN-CODEINE 100-10 MG/5ML PO SOLN: 10.0000 mL | Freq: Four times a day (QID) | ORAL | 0 refills | Status: DC | PRN

## 2016-12-17 NOTE — Discharge Instructions (Signed)
You may alternate Tylenol 1000 mg every 6 hours as needed for pain and Ibuprofen 800 mg every 8 hours as needed for pain.  Please take Ibuprofen with food. ° °

## 2016-12-17 NOTE — ED Provider Notes (Signed)
TIME SEEN: 1:48 AM  CHIEF COMPLAINT: Cough, chest pain  HPI: Patient is a 36 year old female with previous history of alcoholism currently sober who presents to the emergency department with complaints of productive cough, shortness of breath and chest pain.  States symptoms started 2 weeks ago with fever, runny nose, sore throat and then moved into her chest.  Progressively worsening and not improving.  No sick contacts.  States sharp left-sided chest pains mostly with coughing.  Has had fever at home.  No vomiting or diarrhea.  Does not wear oxygen chronically.  No history of CAD, PE or DVT.  ROS: See HPI Constitutional:  fever  Eyes: no drainage  ENT: no runny nose   Cardiovascular:   chest pain  Resp:  SOB  GI: no vomiting GU: no dysuria Integumentary: no rash  Allergy: no hives  Musculoskeletal: no leg swelling  Neurological: no slurred speech ROS otherwise negative  PAST MEDICAL HISTORY/PAST SURGICAL HISTORY:  Past Medical History:  Diagnosis Date  . Alcoholism in recovery [facility])    Been in recovery since 2010  . Hx of chlamydia infection   . Hx of pyelonephritis   . No pertinent past medical history   . Normal pregnancy in third trimester 12/04/2013  . Scoliosis    mild  . Supervision of normal pregnancy 11/02/2010  . SVD (spontaneous vaginal delivery) 12/05/2013  . Vaginal Pap smear, abnormal     MEDICATIONS:  Prior to Admission medications   Medication Sig Start Date End Date Taking? Authorizing Provider  ALPRAZolam Prudy Feeler(XANAX) 0.5 MG tablet Take 1 tablet (0.5 mg total) by mouth at bedtime as needed for anxiety. 10/30/15   Gilda CreasePollina, Christopher J, MD  Aspirin-Acetaminophen-Caffeine [phone] MG PACK Take 1 Package by mouth daily as needed (headache).    [provider]  busPIRone (BUSPAR) 10 MG tablet Take 10 mg by mouth 2 (two) times daily.    [provider]  calcium carbonate (TUMS - DOSED IN MG ELEMENTAL CALCIUM) 500 MG chewable tablet Chew 1 tablet by  mouth 2 (two) times daily as needed for indigestion or heartburn.    [provider]  diazepam (VALIUM) 5 MG tablet Take 1 tablet (5 mg total) by mouth every 8 (eight) hours as needed for muscle spasms. 07/30/16   Ward, Layla MawKristen N, DO  ibuprofen (ADVIL,MOTRIN) 800 MG tablet Take 1 tablet (800 mg total) by mouth every 8 (eight) hours as needed. 07/11/16   Lawyer, Cristal Deerhristopher, PA-C  omeprazole (PRILOSEC) 40 MG capsule Take 1 capsule (40 mg total) by mouth daily. 09/25/16   Dorena BodoKennard, Lawrence, NP  ondansetron (ZOFRAN ODT) 4 MG disintegrating tablet Take 1 tablet (4 mg total) by mouth every 6 (six) hours as needed for nausea or vomiting. 07/30/16   Ward, Layla MawKristen N, DO  oxymetazoline (AFRIN) 0.05 % nasal spray Place 1 spray into both nostrils daily as needed for congestion.    [provider]  promethazine (PHENERGAN) 25 MG tablet Take 1 tablet (25 mg total) by mouth every 8 (eight) hours as needed for nausea or vomiting. 07/11/16   Lawyer, Cristal Deerhristopher, PA-C  traMADol (ULTRAM) 50 MG tablet Take 1 tablet (50 mg total) by mouth every 6 (six) hours as needed for severe pain. 07/11/16   Lawyer, Cristal Deerhristopher, PA-C    ALLERGIES:  No Known Allergies  SOCIAL HISTORY:  Social History  Substance Use Topics  . Smoking status: Current Every Day Smoker    Packs/day: 0.50    Years: 15.00    Types: Cigarettes  .  Smokeless tobacco: Never Used  . Alcohol use Yes     Comment: occasional    FAMILY HISTORY: Family History  Problem Relation Age of Onset  . Diabetes Father   . Depression Paternal Grandfather     EXAM: BP 111/75   Pulse 98   Temp 99.2 F (37.3 C) (Oral)   Resp 16   LMP 11/09/2016   SpO2 95%  CONSTITUTIONAL: Alert and oriented and responds appropriately to questions. Well-appearing; well-nourished, afebrile, nontoxic, well hydrated HEAD: Normocephalic EYES: Conjunctivae clear, pupils appear equal, EOMI ENT: normal nose; moist mucous membranes NECK: Supple, no meningismus, no  nuchal rigidity, no LAD  CARD: RRR; S1 and S2 appreciated; no murmurs, no clicks, no rubs, no gallops RESP: Normal chest excursion without splinting or tachypnea; breath sounds clear and equal bilaterally; no wheezes, no rhonchi, no rales, no hypoxia or respiratory distress, speaking full sentences ABD/GI: Normal bowel sounds; non-distended; soft, non-tender, no rebound, no guarding, no peritoneal signs, no hepatosplenomegaly BACK:  The back appears normal and is non-tender to palpation, there is no CVA tenderness EXT: Normal ROM in all joints; non-tender to palpation; no edema; normal capillary refill; no cyanosis, no calf tenderness or swelling    SKIN: Normal color for age and race; warm; no rash NEURO: Moves all extremities equally PSYCH: The patient's mood and manner are appropriate. Grooming and personal hygiene are appropriate.  MEDICAL DECISION MAKING: Patient here with upper respiratory infectious symptoms.  Chest x-ray obtained in triage is a left lower lobe infiltrate.  Will start her on Levaquin.  She does have a leukocytosis of 13,000.  Troponin obtained in triage is negative but I have very low suspicion that this is ACS, PE, dissection.  Will discharge with guaifenesin with codeine, instructions to alternate Tylenol and Motrin.  Will provide work note.  Will discharge on 5 days of Levaquin.  Discussed return precautions.  Patient comfortable with this plan.  EKG shows no ischemic abnormality.  At this time, I do not feel there is any life-threatening condition present. I have reviewed and discussed all results (EKG, imaging, lab, urine as appropriate) and exam findings with patient/family. I have reviewed nursing notes and appropriate previous records.  I feel the patient is safe to be discharged home without further emergent workup and can continue workup as an outpatient as needed. Discussed usual and customary return precautions. Patient/family verbalize understanding and are  comfortable with this plan.  Outpatient follow-up has been provided if needed. All questions have been answered.    EKG Interpretation  Date/Time:  Thursday December 17 2016 02:04:08 EDT Ventricular Rate:  84 PR Interval:    QRS Duration: 106 QT Interval:  389 QTC Calculation: 460 R Axis:   103 Text Interpretation:  Sinus rhythm Borderline right axis deviation No significant change since last tracing Confirmed by Ward, Baxter Hire [phone]) on 12/17/2016 2:30:03 AM           Ward, Layla Maw, DO 12/17/16 0230

## 2017-06-14 ENCOUNTER — Ambulatory Visit (HOSPITAL_COMMUNITY)
Admission: EM | Admit: 2017-06-14 | Discharge: 2017-06-14 | Disposition: A | Discharge status: Home / Self Care | Payer: Medicaid Other | Attending: Family Medicine | Admitting: Family Medicine

## 2017-06-14 ENCOUNTER — Encounter (HOSPITAL_COMMUNITY): Payer: Self-pay | Admitting: Emergency Medicine

## 2017-06-14 ENCOUNTER — Other Ambulatory Visit: Payer: Self-pay

## 2017-06-14 ENCOUNTER — Ambulatory Visit (INDEPENDENT_AMBULATORY_CARE_PROVIDER_SITE_OTHER): Payer: Medicaid Other

## 2017-06-14 DIAGNOSIS — J40 Bronchitis, not specified as acute or chronic: Secondary | ICD-10-CM | POA: Diagnosis not present

## 2017-06-14 MED ORDER — PREDNISONE 50 MG PO TABS: 50.0000 mg | ORAL_TABLET | Freq: Every day | ORAL | 0 refills | Status: AC

## 2017-06-14 MED ORDER — BENZONATATE 100 MG PO CAPS: 100.0000 mg | ORAL_CAPSULE | Freq: Three times a day (TID) | ORAL | 0 refills | Status: DC

## 2017-06-14 MED ORDER — IPRATROPIUM BROMIDE 0.06 % NA SOLN: 2.0000 | Freq: Four times a day (QID) | NASAL | 0 refills | Status: DC

## 2017-06-14 MED ORDER — AZITHROMYCIN 250 MG PO TABS: 250.0000 mg | ORAL_TABLET | Freq: Every day | ORAL | 0 refills | Status: DC

## 2017-06-14 MED ORDER — CETIRIZINE HCL 10 MG PO TABS: 10.0000 mg | ORAL_TABLET | Freq: Every day | ORAL | 0 refills | Status: AC

## 2017-06-14 NOTE — ED Provider Notes (Signed)
[facility]    CSN: [id number] Arrival date & time: 06/14/17  1004     History   Chief Complaint Chief Complaint  Patient presents with  . URI    HPI Stacy [name] is a 37 y.o. female.   37 year old female comes in for 9-day history of URI symptoms.  States has had nasal congestion, rhinorrhea, sinus pressure, sore throat, productive cough.  Now with some left rib soreness during cough that reminded her of when she had pneumonia.  States had subjective fever when symptoms first started.  No  documented fever.  Has been taking NyQuil, Tylenol, Motrin without relief.  Current everyday smoker,  10-pack-year history.     Past Medical History:  Diagnosis Date  . Alcoholism in recovery [facility])    Been in recovery since 2010  . Hx of chlamydia infection   . Hx of pyelonephritis   . No pertinent past medical history   . Normal pregnancy in third trimester 12/04/2013  . Scoliosis    mild  . Supervision of normal pregnancy 11/02/2010  . SVD (spontaneous vaginal delivery) 12/05/2013  . Vaginal Pap smear, abnormal     Patient Active Problem List   Diagnosis Date Noted  . Other normal pregnancy, not first 12/05/2013  . SVD (spontaneous vaginal delivery) 12/05/2013  . Normal pregnancy in third trimester 12/04/2013    Past Surgical History:  Procedure Laterality Date  . CHOLECYSTECTOMY  7 years ago  . ECTOPIC PREGNANCY SURGERY  2-3 years ago   just tube removal  . NO PAST SURGERIES    . SALPINGECTOMY  2008   right    OB History    Gravida  5   Para  4   Term  4   Preterm  0   AB  1   Living  4     SAB  0   TAB  0   Ectopic  1   Multiple  0   Live Births  4            Home Medications    Prior to Admission medications   Medication Sig Start Date End Date Taking? Authorizing Provider  ALPRAZolam Prudy Feeler) 0.5 MG tablet Take 1 tablet (0.5 mg total) by mouth at bedtime as needed for anxiety. 10/30/15   [name], MD    Aspirin-Acetaminophen-Caffeine [phone] MG PACK Take 1 Package by mouth daily as needed (headache).    [provider]  azithromycin (ZITHROMAX) 250 MG tablet Take 1 tablet (250 mg total) by mouth daily. Take first 2 tablets together, then 1 every day until finished. 06/14/17   Cathie Hoops, Stesha Neyens V, PA-C  benzonatate (TESSALON) 100 MG capsule Take 1 capsule (100 mg total) by mouth every 8 (eight) hours. 06/14/17   Cathie Hoops, Mico Spark V, PA-C  calcium carbonate (TUMS - DOSED IN MG ELEMENTAL CALCIUM) 500 MG chewable tablet Chew 1 tablet by mouth 2 (two) times daily as needed for indigestion or heartburn.    [provider]  cetirizine (ZYRTEC) 10 MG tablet Take 1 tablet (10 mg total) by mouth daily. 06/14/17   Cathie Hoops, Zoraya Fiorenza V, PA-C  ibuprofen (ADVIL,MOTRIN) 800 MG tablet Take 1 tablet (800 mg total) by mouth every 8 (eight) hours as needed. Patient taking differently: Take 800 mg by mouth every 8 (eight) hours as needed for moderate pain.  07/11/16   Lawyer, Cristal Deer, PA-C  ipratropium (ATROVENT) 0.06 % nasal spray Place 2 sprays into both nostrils 4 (four) times daily. 06/14/17  Cathie Hoops, Librado Guandique V, PA-C  ondansetron (ZOFRAN ODT) 4 MG disintegrating tablet Take 1 tablet (4 mg total) by mouth every 6 (six) hours as needed for nausea or vomiting. 07/30/16   Ward, Layla Maw, DO  oxymetazoline (AFRIN) 0.05 % nasal spray Place 1 spray into both nostrils daily as needed for congestion.    [provider]  predniSONE (DELTASONE) 50 MG tablet Take 1 tablet (50 mg total) by mouth daily for 5 days. 06/14/17 06/19/17  Belinda Fisher, PA-C  promethazine (PHENERGAN) 25 MG tablet Take 1 tablet (25 mg total) by mouth every 8 (eight) hours as needed for nausea or vomiting. 07/11/16   Charlestine Night, PA-C    Family History Family History  Problem Relation Age of Onset  . Diabetes Father   . Depression Paternal Grandfather     Social History Social History   Tobacco Use  . Smoking status: Current Every Day Smoker     Packs/day: 0.50    Years: 15.00    Pack years: 7.50    Types: Cigarettes  . Smokeless tobacco: Never Used  Substance Use Topics  . Alcohol use: Yes    Comment: occasional  . Drug use: No     Allergies   Patient has no known allergies.   Review of Systems Review of Systems  Reason unable to perform ROS: See HPI as above.     Physical Exam Triage Vital Signs ED Triage Vitals  Enc Vitals Group     BP 06/14/17 1036 116/71     Pulse Rate 06/14/17 1036 94     Resp 06/14/17 1036 18     Temp 06/14/17 1036 98.4 F (36.9 C)     Temp Source 06/14/17 1036 Oral     SpO2 06/14/17 1036 98 %     Weight --      Height --      Head Circumference --      Peak Flow --      Pain Score 06/14/17 1033 8     Pain Loc --      Pain Edu? --      Excl. in GC? --    No data found.  Updated Vital Signs BP 116/71 (BP Location: Left Arm)   Pulse 94   Temp 98.4 F (36.9 C) (Oral)   Resp 18   LMP 05/07/2017   SpO2 98%   Physical Exam  Constitutional: She is oriented to person, place, and time. She appears well-developed and well-nourished. No distress.  HENT:  Head: Normocephalic and atraumatic.  Right Ear: Tympanic membrane, external ear and ear canal normal. Tympanic membrane is not erythematous and not bulging.  Left Ear: Tympanic membrane, external ear and ear canal normal. Tympanic membrane is not erythematous and not bulging.  Nose: Mucosal edema and rhinorrhea present. Right sinus exhibits no maxillary sinus tenderness and no frontal sinus tenderness. Left sinus exhibits no maxillary sinus tenderness and no frontal sinus tenderness.  Mouth/Throat: Uvula is midline, oropharynx is clear and moist and mucous membranes are normal.  Eyes: Pupils are equal, round, and reactive to light. Conjunctivae are normal.  Neck: Normal range of motion. Neck supple.  Cardiovascular: Normal rate, regular rhythm and normal heart sounds. Exam reveals no gallop and no friction rub.  No murmur  heard. Pulmonary/Chest: Effort normal. No accessory muscle usage. No respiratory distress.  Coarse breath sounds throughout.  No obvious wheezing, crackles.  Lymphadenopathy:    She has no cervical adenopathy.  Neurological: She is alert  and oriented to person, place, and time.  Skin: Skin is warm and dry.  Psychiatric: She has a normal mood and affect. Her behavior is normal. Judgment normal.     UC Treatments / Results  Labs (all labs ordered are listed, but only abnormal results are displayed) Labs Reviewed - No data to display  EKG None Radiology Dg Chest 2 View  Result Date: 06/14/2017 CLINICAL DATA:  Cough and congestion EXAM: CHEST - 2 VIEW COMPARISON:  December 16, 2016 FINDINGS: There is no edema or consolidation. The heart size and pulmonary vascularity are normal. No adenopathy. No pneumothorax. No bone lesions. IMPRESSION: No edema or consolidation. Electronically Signed   By: Bretta BangWilliam  Woodruff III M.D.   On: 06/14/2017 11:05    Procedures Procedures (including critical care time)  Medications Ordered in UC Medications - No data to display   Initial Impression / Assessment and Plan / UC Course  I have reviewed the triage vital signs and the nursing notes.  Pertinent labs & imaging results that were available during my care of the patient were reviewed by me and considered in my medical decision making (see chart for details).    Chest x-ray negative for pneumonia.  Start azithromycin and prednisone for bronchitis.  Other symptomatic treatment discussed.  Push fluids.  Return precautions given.  Final Clinical Impressions(s) / UC Diagnoses   Final diagnoses:  Bronchitis    ED Discharge Orders        Ordered    azithromycin (ZITHROMAX) 250 MG tablet  Daily     06/14/17 1112    predniSONE (DELTASONE) 50 MG tablet  Daily     06/14/17 1112    ipratropium (ATROVENT) 0.06 % nasal spray  4 times daily     06/14/17 1112    cetirizine (ZYRTEC) 10 MG tablet  Daily      06/14/17 1112    benzonatate (TESSALON) 100 MG capsule  Every 8 hours     06/14/17 1112        Belinda FisherYu, Adaira Centola V, PA-C 06/14/17 1122

## 2017-06-14 NOTE — Discharge Instructions (Addendum)
X-ray negative for pneumonia. Start Azithromycin and prednisone as directed for bronchitis. Tessalon for cough. Start atrovent nasal spray, zyrtec for nasal congestion/drainage. You can use over the counter nasal saline rinse such as neti pot for nasal congestion. Keep hydrated, your urine should be clear to pale yellow in color. Tylenol/motrin for fever and pain. Monitor for any worsening of symptoms, chest pain, shortness of breath, wheezing, swelling of the throat, follow up for reevaluation.   For sore throat try using a honey-based tea. Use 3 teaspoons of honey with juice squeezed from half lemon. Place shaved pieces of ginger into 1/2-1 cup of water and warm over stove top. Then mix the ingredients and repeat every 4 hours as needed.

## 2017-06-14 NOTE — ED Triage Notes (Signed)
9 days of feeling bad.  Patient has had stuffy nose, facial pain, now chest congestion and swelling on left side of throat, and has chest soreness that reminds patient of when she had pneumonia

## 2018-02-10 ENCOUNTER — Emergency Department (HOSPITAL_BASED_OUTPATIENT_CLINIC_OR_DEPARTMENT_OTHER): Payer: Self-pay

## 2018-02-10 ENCOUNTER — Emergency Department (HOSPITAL_BASED_OUTPATIENT_CLINIC_OR_DEPARTMENT_OTHER)
Admission: EM | Admit: 2018-02-10 | Discharge: 2018-02-10 | Disposition: A | Discharge status: Home / Self Care | Payer: Self-pay | Attending: Emergency Medicine | Admitting: Emergency Medicine

## 2018-02-10 ENCOUNTER — Encounter (HOSPITAL_BASED_OUTPATIENT_CLINIC_OR_DEPARTMENT_OTHER): Payer: Self-pay

## 2018-02-10 ENCOUNTER — Other Ambulatory Visit: Payer: Self-pay

## 2018-02-10 DIAGNOSIS — J189 Pneumonia, unspecified organism: Secondary | ICD-10-CM

## 2018-02-10 DIAGNOSIS — J181 Lobar pneumonia, unspecified organism: Secondary | ICD-10-CM | POA: Insufficient documentation

## 2018-02-10 DIAGNOSIS — Z79899 Other long term (current) drug therapy: Secondary | ICD-10-CM | POA: Insufficient documentation

## 2018-02-10 MED ORDER — AMOXICILLIN 500 MG PO CAPS: 1000.0000 mg | ORAL_CAPSULE | Freq: Three times a day (TID) | ORAL | 0 refills | Status: DC

## 2018-02-10 MED ORDER — AZITHROMYCIN 250 MG PO TABS: 250.0000 mg | ORAL_TABLET | Freq: Every day | ORAL | 0 refills | Status: DC

## 2018-02-10 MED ORDER — BENZONATATE 100 MG PO CAPS: 100.0000 mg | ORAL_CAPSULE | Freq: Three times a day (TID) | ORAL | 0 refills | Status: DC

## 2018-02-10 MED ORDER — AZITHROMYCIN 250 MG PO TABS
500.0000 mg | ORAL_TABLET | Freq: Once | ORAL | Status: AC
  Administered 2018-02-10: 500 mg via ORAL
  Filled 2018-02-10: qty 2

## 2018-02-10 MED ORDER — ACETAMINOPHEN 500 MG PO TABS
1000.0000 mg | ORAL_TABLET | Freq: Once | ORAL | Status: AC
  Administered 2018-02-10: 1000 mg via ORAL
  Filled 2018-02-10: qty 2

## 2018-02-10 MED ORDER — AMOXICILLIN 500 MG PO CAPS
1000.0000 mg | ORAL_CAPSULE | Freq: Once | ORAL | Status: AC
  Administered 2018-02-10: 1000 mg via ORAL
  Filled 2018-02-10: qty 2

## 2018-02-10 MED ORDER — IBUPROFEN 400 MG PO TABS: 600.0000 mg | ORAL_TABLET | Freq: Once | ORAL | Status: DC

## 2018-02-10 MED ORDER — IBUPROFEN 100 MG/5ML PO SUSP
ORAL | Status: AC
  Filled 2018-02-10: qty 30

## 2018-02-10 MED ORDER — IBUPROFEN 100 MG/5ML PO SUSP
600.0000 mg | Freq: Once | ORAL | Status: AC
  Administered 2018-02-10: 600 mg via ORAL

## 2018-02-10 MED ORDER — AZITHROMYCIN 200 MG/5ML PO SUSR: 250.0000 mg | Freq: Every day | ORAL | 0 refills | Status: AC

## 2018-02-10 MED ORDER — AMOXICILLIN 400 MG/5ML PO SUSR: 1000.0000 mg | Freq: Two times a day (BID) | ORAL | 0 refills | Status: AC

## 2018-02-10 NOTE — Discharge Instructions (Addendum)
Your chest x-ray shows a mild pneumonia, please take antibiotics as directed, you can use Profen and Tylenol for pain and fever, as well as prescribed cough medication.  Follow-up with your primary care doctor in the next week, return to the emergency department if you have worsening cough, persistent high fevers, chest pain, shortness of breath or any other new or concerning symptoms.

## 2018-02-10 NOTE — ED Provider Notes (Signed)
[facility] EMERGENCY DEPARTMENT Provider Note   CSN: [id number] Arrival date & time: 02/10/18  2011     History   Chief Complaint Chief Complaint  Patient presents with  . Cough    HPI Stacy [name] is a 37 y.o. female.  Stacy [name] is a 37 y.o. female with a history of alcoholism in recovery, otherwise healthy, who presents to the emergency department for evaluation of 3 days of fevers, chills, cough, congestion, rhinorrhea, sore throat and body aches.  Since onset symptoms have been constant and not improving.  She reports some soreness throughout from cough but no localized chest pain or shortness of breath.  She has had fevers up to 102 at home and has been treating this with DayQuil and NyQuil with minimal improvement in her symptoms.  She reports her children have been sick at home and her husband was diagnosed with pneumonia last week.  She has not tried anything else to treat her symptoms, denies any other aggravating or alleviating factors.  Did not receive a flu vaccination this year.     Past Medical History:  Diagnosis Date  . Alcoholism in recovery [facility])    Been in recovery since 2010  . Hx of chlamydia infection   . Hx of pyelonephritis   . No pertinent past medical history   . Normal pregnancy in third trimester 12/04/2013  . Scoliosis    mild  . Supervision of normal pregnancy 11/02/2010  . SVD (spontaneous vaginal delivery) 12/05/2013  . Vaginal Pap smear, abnormal     Patient Active Problem List   Diagnosis Date Noted  . Other normal pregnancy, not first 12/05/2013  . SVD (spontaneous vaginal delivery) 12/05/2013  . Normal pregnancy in third trimester 12/04/2013    Past Surgical History:  Procedure Laterality Date  . CHOLECYSTECTOMY  7 years ago  . ECTOPIC PREGNANCY SURGERY  2-3 years ago   just tube removal  . NO PAST SURGERIES    . SALPINGECTOMY  2008   right     OB History    Gravida  5   Para  4   Term  4   Preterm  0    AB  1   Living  4     SAB  0   TAB  0   Ectopic  1   Multiple  0   Live Births  4            Home Medications    Prior to Admission medications   Medication Sig Start Date End Date Taking? Authorizing Provider  ALPRAZolam Prudy Feeler) 0.5 MG tablet Take 1 tablet (0.5 mg total) by mouth at bedtime as needed for anxiety. 10/30/15   [name], MD  amoxicillin (AMOXIL) 400 MG/5ML suspension Take 12.5 mLs (1,000 mg total) by mouth 2 (two) times daily for 7 days. 02/10/18 02/17/18  Dartha Lodge, PA-C  Aspirin-Acetaminophen-Caffeine [phone] MG PACK Take 1 Package by mouth daily as needed (headache).    [provider]  azithromycin (ZITHROMAX) 200 MG/5ML suspension Take 6.3 mLs (250 mg total) by mouth daily for 4 days. 02/10/18 02/14/18  Dartha Lodge, PA-C  benzonatate (TESSALON) 100 MG capsule Take 1 capsule (100 mg total) by mouth every 8 (eight) hours. 02/10/18   Dartha Lodge, PA-C  calcium carbonate (TUMS - DOSED IN MG ELEMENTAL CALCIUM) 500 MG chewable tablet Chew 1 tablet by mouth 2 (two) times daily as needed for indigestion or heartburn.  [provider]  cetirizine (ZYRTEC) 10 MG tablet Take 1 tablet (10 mg total) by mouth daily. 06/14/17   Cathie Hoops, Amy V, PA-C  ibuprofen (ADVIL,MOTRIN) 800 MG tablet Take 1 tablet (800 mg total) by mouth every 8 (eight) hours as needed. Patient taking differently: Take 800 mg by mouth every 8 (eight) hours as needed for moderate pain.  07/11/16   Lawyer, Cristal Deer, PA-C  ipratropium (ATROVENT) 0.06 % nasal spray Place 2 sprays into both nostrils 4 (four) times daily. 06/14/17   Cathie Hoops, Amy V, PA-C  oxymetazoline (AFRIN) 0.05 % nasal spray Place 1 spray into both nostrils daily as needed for congestion.    [provider]    Family History Family History  Problem Relation Age of Onset  . Diabetes Father   . Depression Paternal Grandfather     Social History Social History   Tobacco Use  . Smoking  status: Current Every Day Smoker    Packs/day: 0.50    Years: 15.00    Pack years: 7.50    Types: Cigarettes  . Smokeless tobacco: Never Used  Substance Use Topics  . Alcohol use: Yes    Comment: occasional  . Drug use: No     Allergies   Patient has no known allergies.   Review of Systems Review of Systems  Constitutional: Positive for chills and fever.  HENT: Positive for congestion, postnasal drip, rhinorrhea, sinus pressure and sore throat. Negative for ear pain.   Eyes: Negative for visual disturbance.  Respiratory: Positive for cough. Negative for shortness of breath.   Cardiovascular: Negative for chest pain.  Gastrointestinal: Negative for abdominal pain, nausea and vomiting.  Musculoskeletal: Positive for myalgias. Negative for arthralgias, neck pain and neck stiffness.  Skin: Negative for color change and rash.  Neurological: Positive for headaches. Negative for dizziness, syncope, weakness, light-headedness and numbness.     Physical Exam Updated Vital Signs BP 123/82 (BP Location: Right Arm)   Pulse (!) 105   Temp 99.6 F (37.6 C) (Oral)   Resp 20   Ht 5\' 7"  (1.702 m)   Wt 61.2 kg   LMP 01/18/2018 (Approximate)   SpO2 97%   BMI 21.14 kg/m   Physical Exam Vitals signs and nursing note reviewed.  Constitutional:      General: She is not in acute distress.    Appearance: Normal appearance. She is well-developed. She is not ill-appearing, toxic-appearing or diaphoretic.  HENT:     Head: Normocephalic and atraumatic.     Right Ear: Tympanic membrane normal.     Left Ear: Tympanic membrane normal.     Ears:     Comments: Tms clear with good ligh reflection bilaterally    Nose: Congestion and rhinorrhea present.     Comments: Nasal mucosa moderately edematous with clear rhinorrhea present    Mouth/Throat:     Mouth: Mucous membranes are moist.     Pharynx: Oropharynx is clear. No oropharyngeal exudate or posterior oropharyngeal erythema.     Comments:  Posterior oropharynx clear and moist with some erythema, no exudates or swelling, no tonsillar edema, uvula midline, normal phonation and tolerating secretions Eyes:     General:        Right eye: No discharge.        Left eye: No discharge.  Neck:     Musculoskeletal: Neck supple.     Comments: No rigidity Cardiovascular:     Rate and Rhythm: Normal rate and regular rhythm.     Heart  sounds: Normal heart sounds.  Pulmonary:     Effort: Pulmonary effort is normal. No respiratory distress.     Breath sounds: Normal breath sounds.     Comments: Respirations equal and unlabored, patient able to speak in full sentences, lungs clear to auscultation bilaterally Abdominal:     General: Bowel sounds are normal. There is no distension.     Palpations: Abdomen is soft. There is no mass.     Tenderness: There is no abdominal tenderness. There is no guarding.     Comments: Abdomen soft, nondistended, nontender to palpation in all quadrants without guarding or peritoneal signs  Musculoskeletal:        General: No deformity.  Lymphadenopathy:     Cervical: No cervical adenopathy.  Skin:    General: Skin is warm and dry.     Capillary Refill: Capillary refill takes less than 2 seconds.  Neurological:     Mental Status: She is alert.      ED Treatments / Results  Labs (all labs ordered are listed, but only abnormal results are displayed) Labs Reviewed - No data to display  EKG None  Radiology Dg Chest 2 View  Result Date: 02/10/2018 CLINICAL DATA:  Productive cough, headache, nausea, vomiting, fever, and shortness of breath for 3 days. Husband diagnosed with pneumonia. Current smoker. EXAM: CHEST - 2 VIEW COMPARISON:  06/14/2017 FINDINGS: Mild hyperinflation. Central interstitial pattern to the lungs with mild peribronchial thickening may represent chronic or acute bronchitis there is a focal area of infiltration in the left lung base behind the heart suggesting focal pneumonia. No  blunting of costophrenic angles. No pneumothorax. Mediastinal contours appear intact. Heart size and pulmonary vascularity are normal. Surgical clips in the right upper quadrant. IMPRESSION: Focal infiltration in the left lung base behind the heart suggesting pneumonia. Peribronchial changes suggesting acute or chronic bronchitis. Electronically Signed   By: Burman NievesWilliam  Stevens M.D.   On: 02/10/2018 22:04    Procedures Procedures (including critical care time)  Medications Ordered in ED Medications  ibuprofen (ADVIL,MOTRIN) tablet 600 mg (has no administration in time range)  ibuprofen (ADVIL,MOTRIN) 100 MG/5ML suspension (has no administration in time range)  acetaminophen (TYLENOL) tablet 1,000 mg (1,000 mg Oral Given 02/10/18 2042)  amoxicillin (AMOXIL) capsule 1,000 mg (1,000 mg Oral Given 02/10/18 2234)  azithromycin (ZITHROMAX) tablet 500 mg (500 mg Oral Given 02/10/18 2234)  ibuprofen (ADVIL,MOTRIN) 100 MG/5ML suspension 600 mg (600 mg Oral Given 02/10/18 2246)     Initial Impression / Assessment and Plan / ED Course  I have reviewed the triage vital signs and the nursing notes.  Pertinent labs & imaging results that were available during my care of the patient were reviewed by me and considered in my medical decision making (see chart for details).  Patient has been diagnosed with CAP via chest xray. Pt is not ill appearing, immunocompromised, and does not have multiple co morbidities, therefore I feel like the they can be treated as an OP with abx therapy. Pt has been advised to return to the ED if symptoms worsen or they do not improve. Pt verbalizes understanding and is agreeable with plan.   Final Clinical Impressions(s) / ED Diagnoses   Final diagnoses:  Community acquired pneumonia of left lower lobe of lung [facility](HCC)    ED Discharge Orders         Ordered    amoxicillin (AMOXIL) 500 MG capsule  3 times daily,   Status:  Discontinued     02/10/18  2231    azithromycin  (ZITHROMAX) 250 MG tablet  Daily,   Status:  Discontinued     02/10/18 2231    benzonatate (TESSALON) 100 MG capsule  Every 8 hours     02/10/18 2231    amoxicillin (AMOXIL) 400 MG/5ML suspension  2 times daily     02/10/18 2242    azithromycin (ZITHROMAX) 200 MG/5ML suspension  Daily     02/10/18 2242           Dartha LodgeFord, Taziah Difatta N, PA-C 02/10/18 2328    Jacalyn LefevreHaviland, Julie, MD 02/10/18 [phone]

## 2018-02-10 NOTE — ED Triage Notes (Signed)
C/o flu like sx day 3-NAD-steady gait 

## 2018-05-18 IMAGING — CR DG CHEST 2V
2 series · 2 of 2 positions shown · non-contrast
Comparison: 10/30/2015

CLINICAL DATA: Fever, body aches

EXAM:
CHEST  2 VIEW

[chest pa]
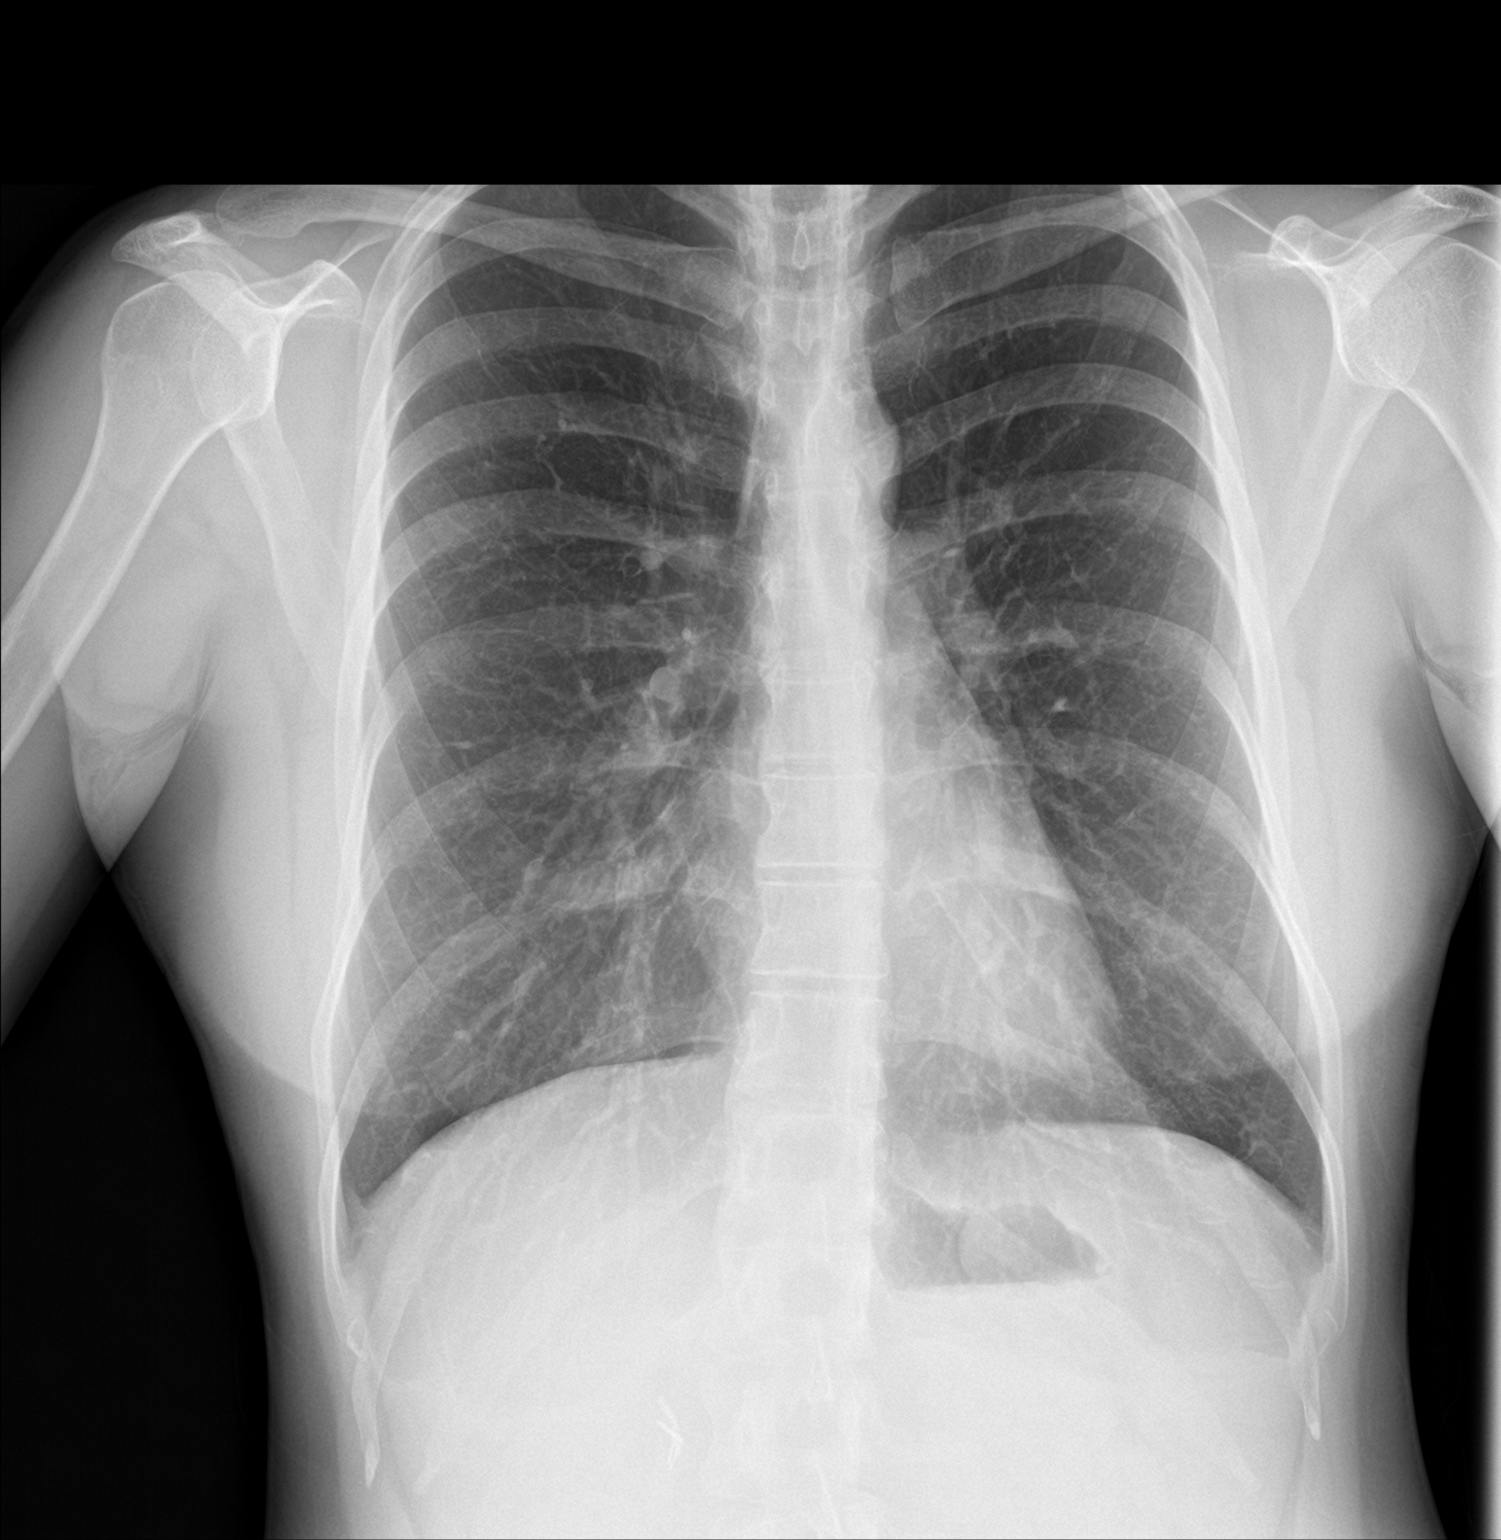

[chest lat]
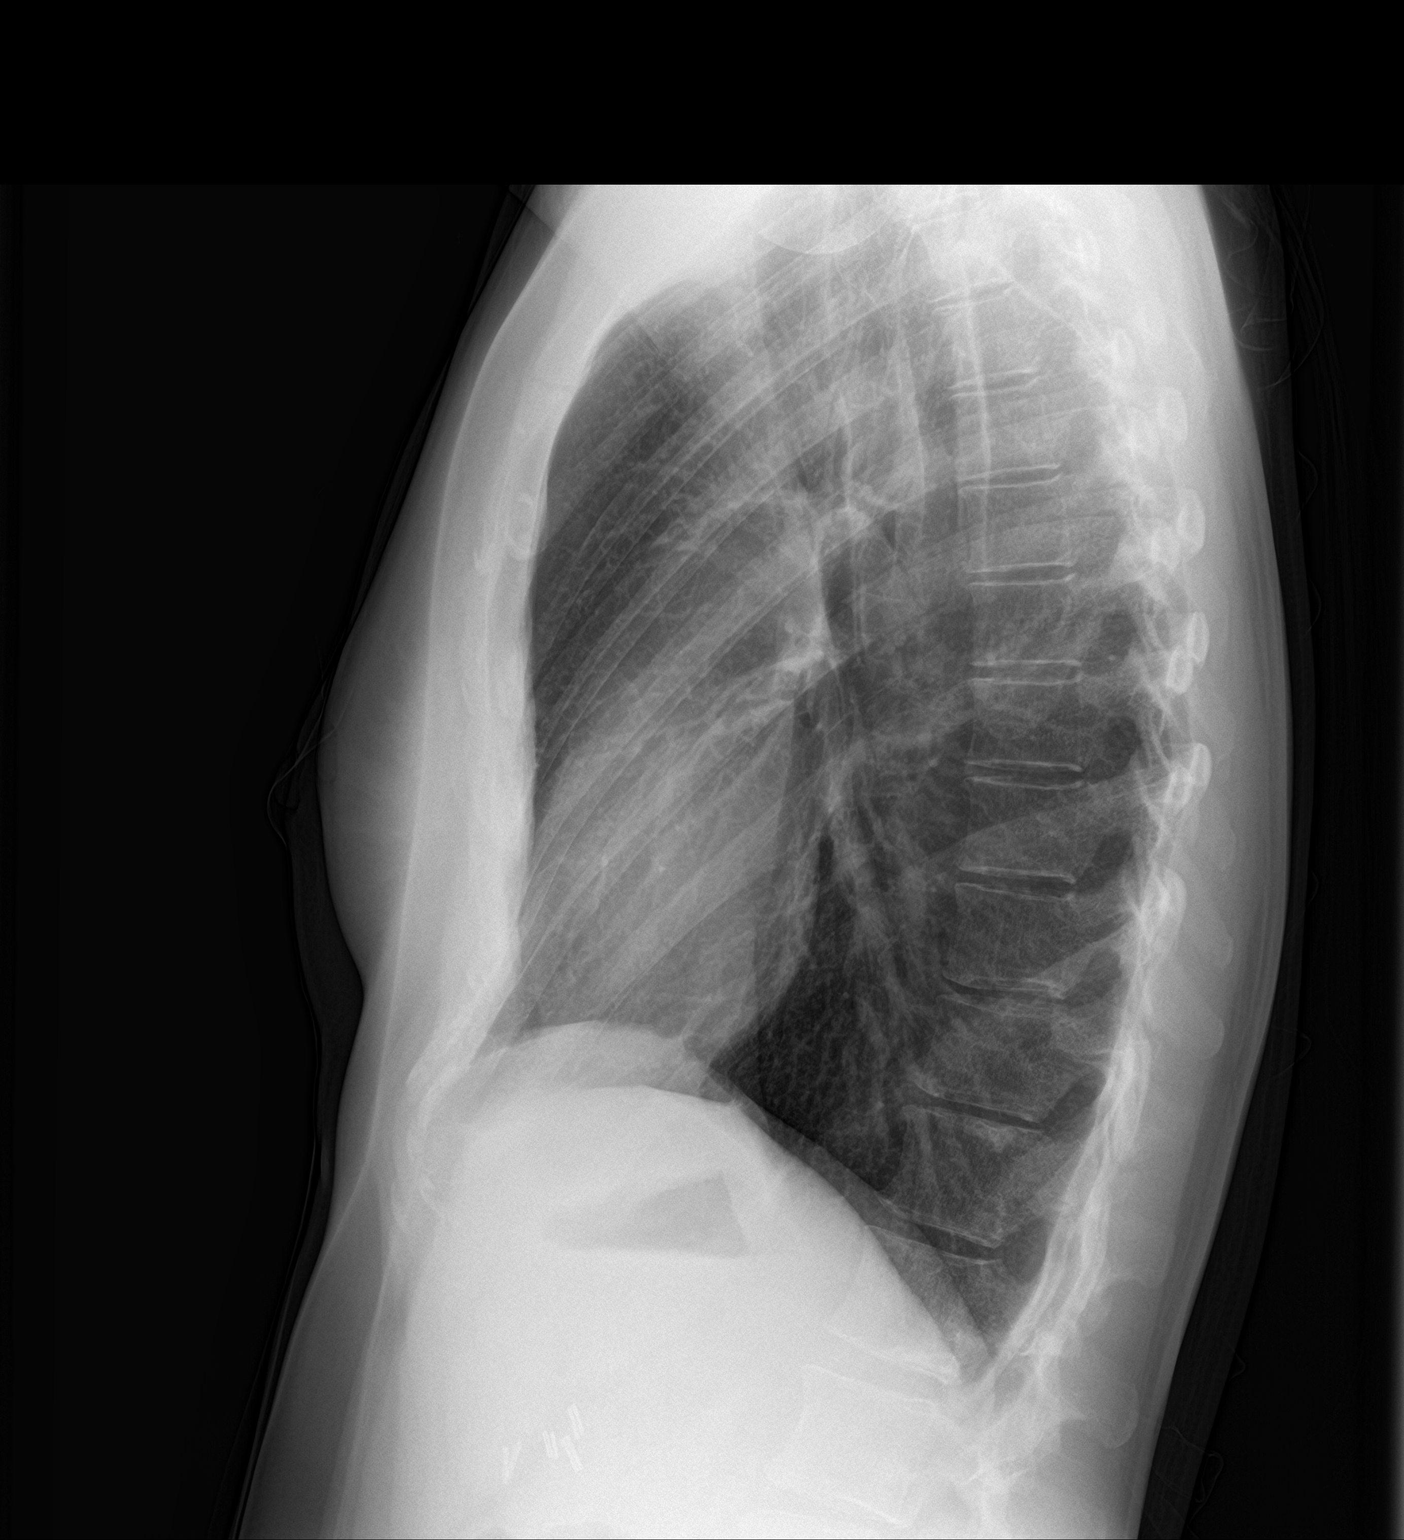

[2 of 2 positions shown; findings below may reference images not displayed]

FINDINGS: The heart size and mediastinal contours are within normal limits.
Both lungs are clear. The visualized skeletal structures are
unremarkable.
IMPRESSION: No active cardiopulmonary disease.

## 2018-05-18 IMAGING — CT CT ABD-PELV W/ CM
2 of 4 series · 15 of 46 positions shown, 17 images · IV contrast (iopamidol)
Comparison: None.

CLINICAL DATA: Lower abdominal pain and back pain with nausea and
vomiting 5 days. Previous cholecystectomy.

EXAM:
CT ABDOMEN AND PELVIS WITH CONTRAST
TECHNIQUE: Multidetector CT imaging of the abdomen and pelvis was performed
using the standard protocol following bolus administration of
intravenous contrast.
CONTRAST:  100mL CTG4Y4-DAA IOPAMIDOL (CTG4Y4-DAA) INJECTION 61%

[Series 3: a/p w/ 5mm · axial · 0.64mm/px · z∈[+762,+1172]mm · 12 of 94 slices shown, 14 images]
[im 8/94  soft-tissue]
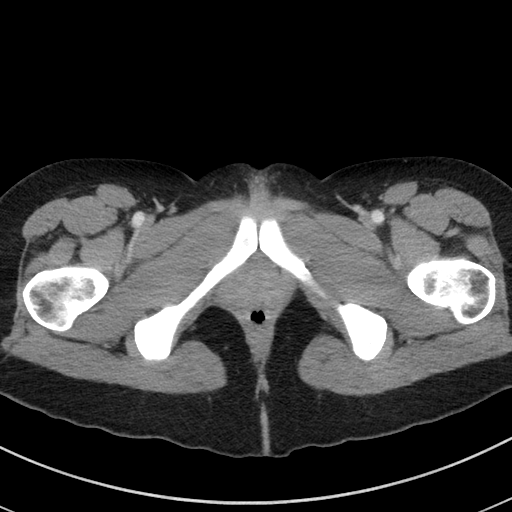
[im 8/94  bone]
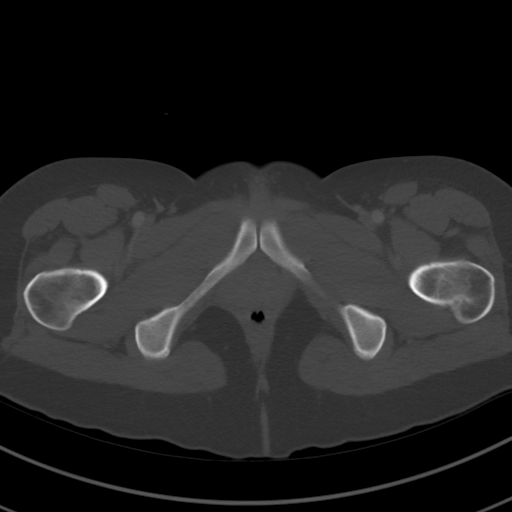
[im 15/94  soft-tissue]
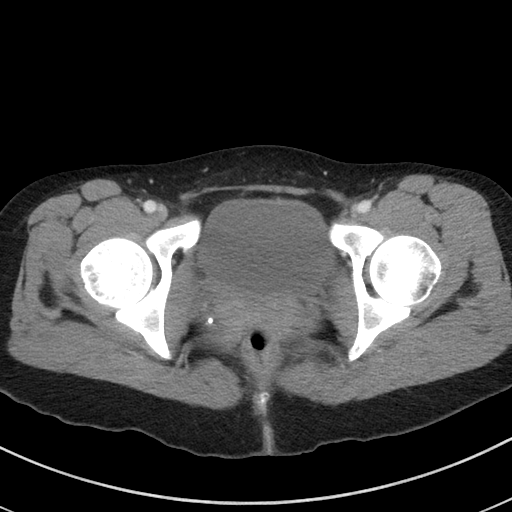
[im 23/94  soft-tissue]
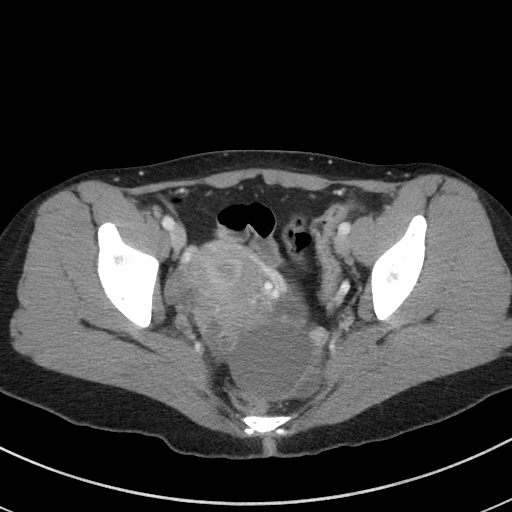
[im 30/94  soft-tissue]
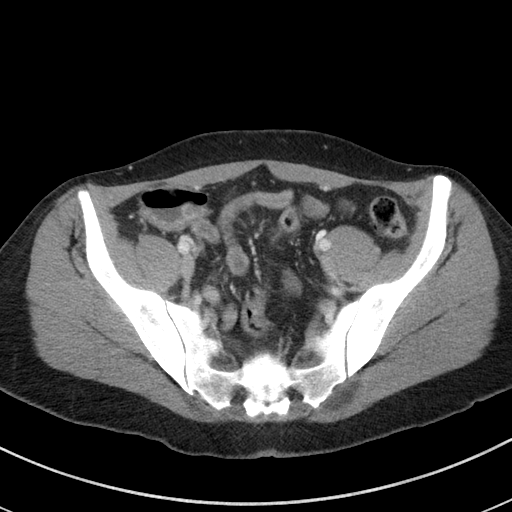
[im 38/94  soft-tissue]
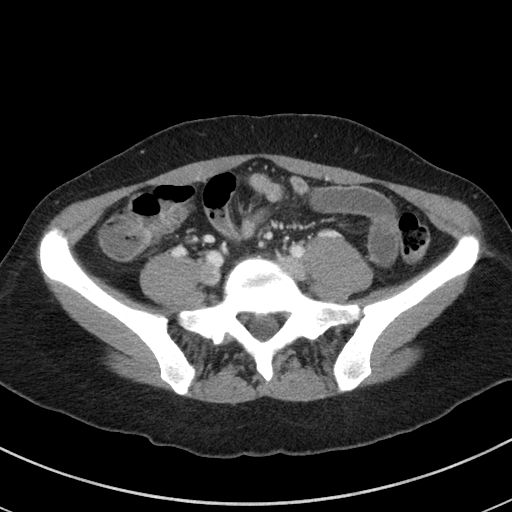
[im 45/94  soft-tissue]
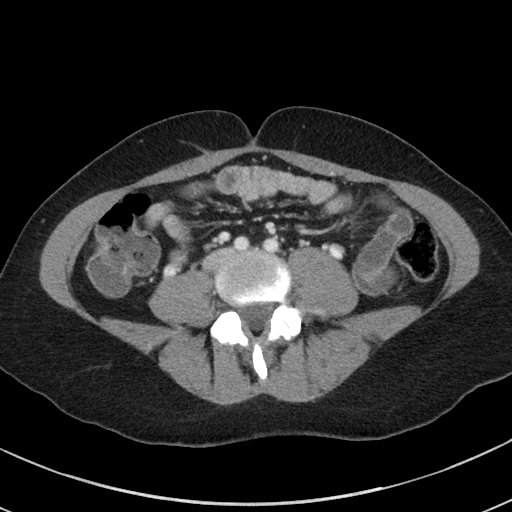
[im 53/94  soft-tissue]
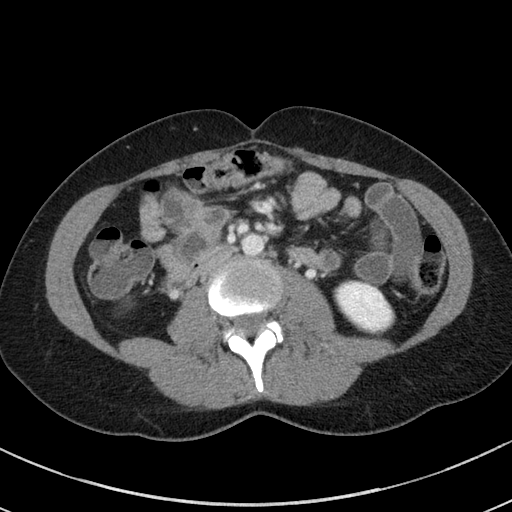
[im 60/94  soft-tissue]
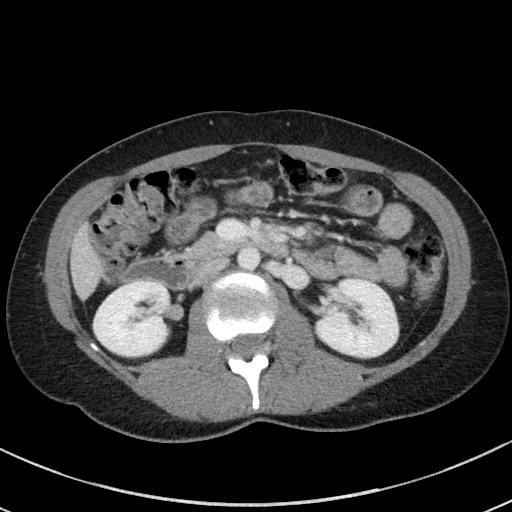
[im 67/94  soft-tissue]
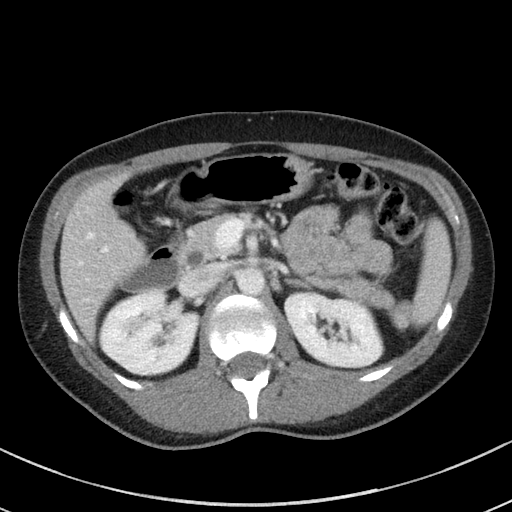
[im 67/94  bone]
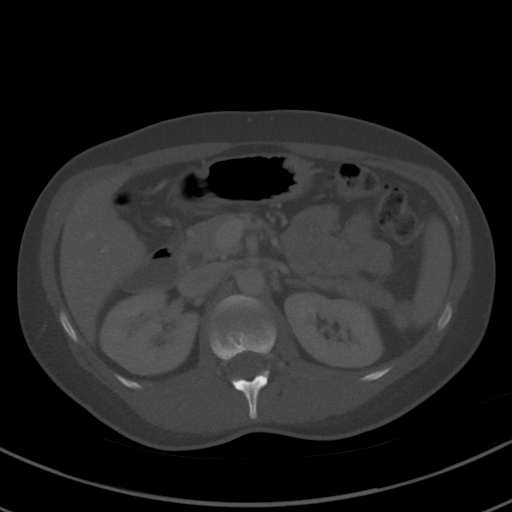
[im 75/94  soft-tissue]
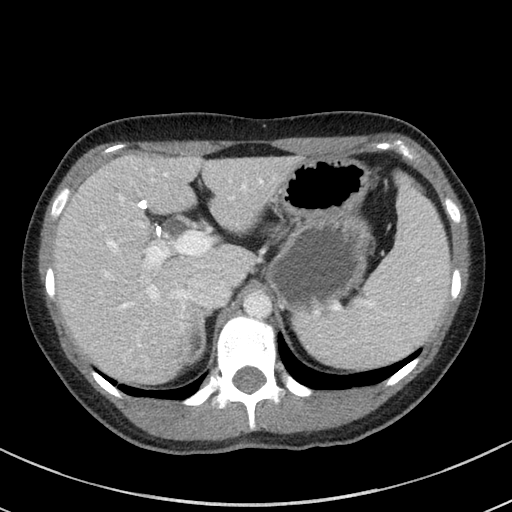
[im 82/94  soft-tissue]
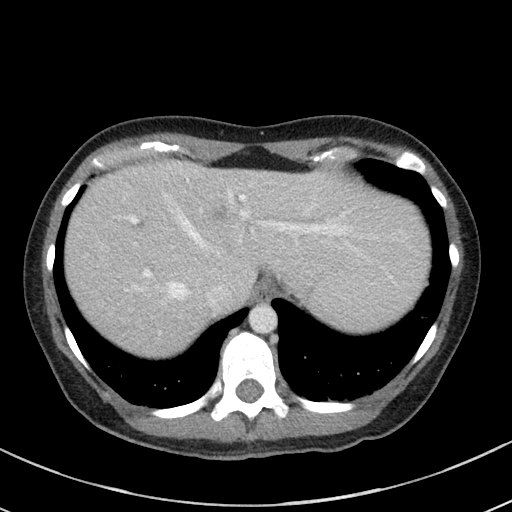
[im 90/94  soft-tissue]
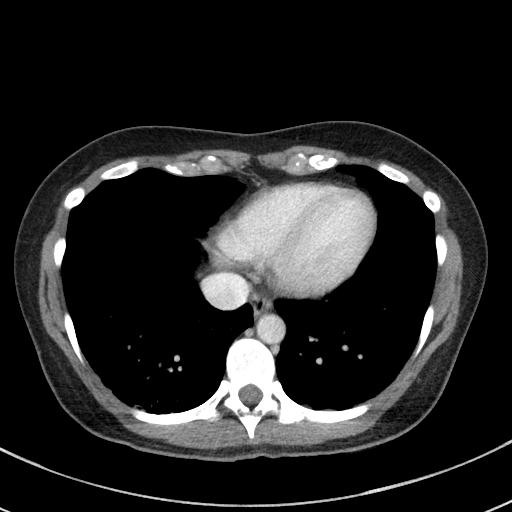

[Series 6: a/p w/ cor · coronal · 0.59mm/px · 3 of 150 slices shown]
[im 50/150  soft-tissue]
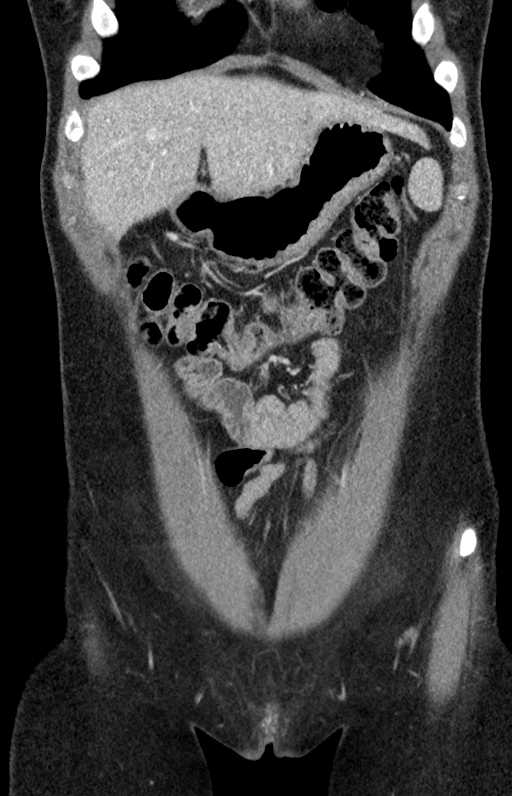
[im 67/150  soft-tissue]
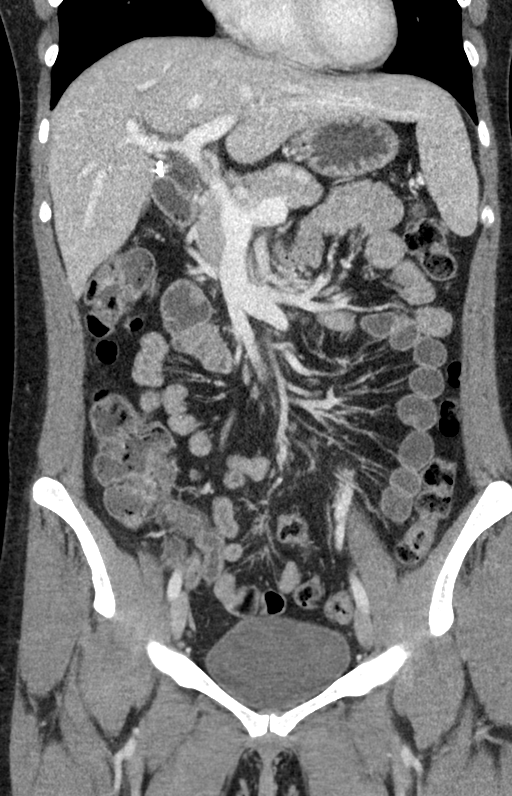
[im 83/150  soft-tissue]
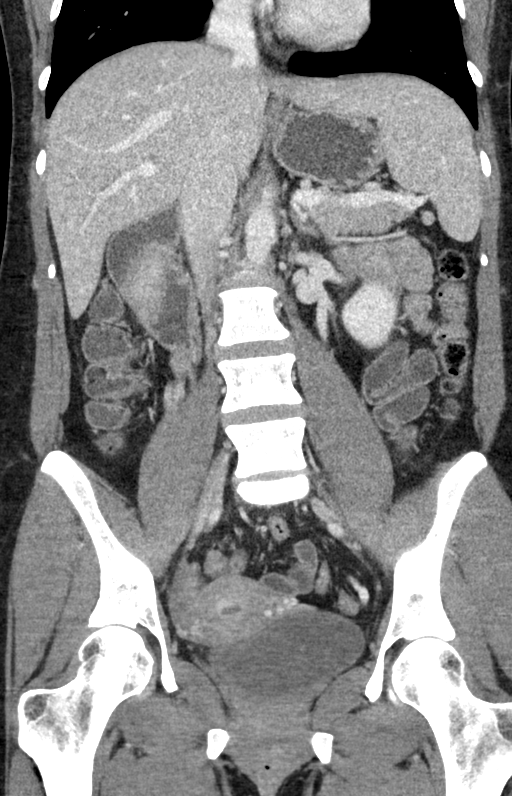

[15 of 46 positions shown; findings below may reference images not displayed]

FINDINGS: Lower chest: Lung bases are within normal.

Hepatobiliary: Previous cholecystectomy. Liver and biliary tree are
unremarkable.

Pancreas: Unremarkable. No pancreatic ductal dilatation or
surrounding inflammatory changes.

Spleen: Normal in size without focal abnormality.

Adrenals/Urinary Tract: Adrenal glands are within normal. Kidneys
are normal in size without hydronephrosis or nephrolithiasis.
Ureters and bladder are within normal.

Stomach/Bowel: Stomach and small bowel are within normal. Appendix
is normal. Colon is unremarkable.

Vascular/Lymphatic: Vascular structures are unremarkable.
Retroaortic left renal vein is present. No adenopathy.

Reproductive: Uterus is right of midline. 5.4 cm predominately
simple appearing left ovarian cyst. No significant free pelvic
fluid.

Other: No focal inflammatory change.

Musculoskeletal: Unremarkable.
IMPRESSION: 5.4 cm predominately simple appearing left ovarian cyst. No adjacent
free fluid or inflammatory change. Consider further evaluation with
pelvic ultrasound for better characterization.

## 2020-06-19 ENCOUNTER — Encounter (HOSPITAL_BASED_OUTPATIENT_CLINIC_OR_DEPARTMENT_OTHER): Payer: Self-pay

## 2020-06-19 ENCOUNTER — Other Ambulatory Visit: Payer: Self-pay

## 2020-06-19 ENCOUNTER — Emergency Department (HOSPITAL_BASED_OUTPATIENT_CLINIC_OR_DEPARTMENT_OTHER)
Admission: EM | Admit: 2020-06-19 | Discharge: 2020-06-19 | Disposition: A | Discharge status: Home / Self Care | Payer: Medicaid Other | Attending: Emergency Medicine | Admitting: Emergency Medicine

## 2020-06-19 DIAGNOSIS — F17210 Nicotine dependence, cigarettes, uncomplicated: Secondary | ICD-10-CM | POA: Diagnosis not present

## 2020-06-19 DIAGNOSIS — B9789 Other viral agents as the cause of diseases classified elsewhere: Secondary | ICD-10-CM | POA: Diagnosis not present

## 2020-06-19 DIAGNOSIS — R519 Headache, unspecified: Secondary | ICD-10-CM

## 2020-06-19 DIAGNOSIS — J069 Acute upper respiratory infection, unspecified: Secondary | ICD-10-CM | POA: Diagnosis not present

## 2020-06-19 DIAGNOSIS — Z7982 Long term (current) use of aspirin: Secondary | ICD-10-CM | POA: Insufficient documentation

## 2020-06-19 DIAGNOSIS — H53149 Visual discomfort, unspecified: Secondary | ICD-10-CM | POA: Diagnosis not present

## 2020-06-19 DIAGNOSIS — R110 Nausea: Secondary | ICD-10-CM | POA: Diagnosis not present

## 2020-06-19 DIAGNOSIS — Z20822 Contact with and (suspected) exposure to covid-19: Secondary | ICD-10-CM | POA: Diagnosis not present

## 2020-06-19 MED ORDER — ACETAMINOPHEN 500 MG PO TABS
1000.0000 mg | ORAL_TABLET | Freq: Once | ORAL | Status: AC
  Administered 2020-06-19: 1000 mg via ORAL
  Filled 2020-06-19: qty 2

## 2020-06-19 MED ORDER — METOCLOPRAMIDE HCL 5 MG/ML IJ SOLN: 10.0000 mg | Freq: Once | INTRAMUSCULAR | Status: DC

## 2020-06-19 NOTE — ED Provider Notes (Signed)
[facility] EMERGENCY DEPARTMENT Provider Note   CSN: [id number] Arrival date & time: 06/19/20  1259     History Chief Complaint  Patient presents with  . Cough    Stacy [name] is a 40 y.o. female past medical history of seasonal allergies, presenting for evaluation of headache.  Patient states she has had headache that feels like pressure in the back of her head, as well as intermittent throbbing right-sided headache that is mostly behind her right eye. Does have some photophobia and nausea with a right-sided headache.  Last week she had about 3 days of symptoms that initially felt like seasonal allergies.  She treated symptomatically and felt fine for another 3 days.  However over the weekend she began having symptoms once again.  She reports some pressure in her ears, intermittent mucus production in her nose, some drainage in her throat causing an intermittent cough.  No fevers or chills.  No persistent nasal congestion, no sinus pain or pressure.  No vision changes.  He does not frequently get headaches.  She has not had any known COVID exposures.  Is not vaccinated against COVID.  Husband had some mild symptoms last week as well.  Has taken Sudafed for her symptoms.  The history is provided by the patient.       Past Medical History:  Diagnosis Date  . Alcoholism in recovery [facility])    Been in recovery since 2010  . Hx of chlamydia infection   . Hx of pyelonephritis   . No pertinent past medical history   . Normal pregnancy in third trimester 12/04/2013  . Scoliosis    mild  . Supervision of normal pregnancy 11/02/2010  . SVD (spontaneous vaginal delivery) 12/05/2013  . Vaginal Pap smear, abnormal     Patient Active Problem List   Diagnosis Date Noted  . Other normal pregnancy, not first 12/05/2013  . SVD (spontaneous vaginal delivery) 12/05/2013  . Normal pregnancy in third trimester 12/04/2013    Past Surgical History:  Procedure Laterality Date  .  CHOLECYSTECTOMY  7 years ago  . ECTOPIC PREGNANCY SURGERY  2-3 years ago   just tube removal  . NO PAST SURGERIES    . SALPINGECTOMY  2008   right     OB History    Gravida  5   Para  4   Term  4   Preterm  0   AB  1   Living  4     SAB  0   IAB  0   Ectopic  1   Multiple  0   Live Births  4           Family History  Problem Relation Age of Onset  . Diabetes Father   . Depression Paternal Grandfather     Social History   Tobacco Use  . Smoking status: Current Every Day Smoker    Packs/day: 0.50    Years: 15.00    Pack years: 7.50    Types: Cigarettes  . Smokeless tobacco: Never Used  Vaping Use  . Vaping Use: Never used  Substance Use Topics  . Alcohol use: Not Currently  . Drug use: No    Home Medications Prior to Admission medications   Medication Sig Start Date End Date Taking? Authorizing Provider  ALPRAZolam Prudy Feeler) 0.5 MG tablet Take 1 tablet (0.5 mg total) by mouth at bedtime as needed for anxiety. 10/30/15   [name], MD  Aspirin-Acetaminophen-Caffeine [phone] MG  PACK Take 1 Package by mouth daily as needed (headache).    [provider]  benzonatate (TESSALON) 100 MG capsule Take 1 capsule (100 mg total) by mouth every 8 (eight) hours. 02/10/18   Dartha Lodge, PA-C  calcium carbonate (TUMS - DOSED IN MG ELEMENTAL CALCIUM) 500 MG chewable tablet Chew 1 tablet by mouth 2 (two) times daily as needed for indigestion or heartburn.    [provider]  cetirizine (ZYRTEC) 10 MG tablet Take 1 tablet (10 mg total) by mouth daily. 06/14/17   Cathie Hoops, Amy V, PA-C  ibuprofen (ADVIL,MOTRIN) 800 MG tablet Take 1 tablet (800 mg total) by mouth every 8 (eight) hours as needed. Patient taking differently: Take 800 mg by mouth every 8 (eight) hours as needed for moderate pain.  07/11/16   Lawyer, Cristal Deer, PA-C  ipratropium (ATROVENT) 0.06 % nasal spray Place 2 sprays into both nostrils 4 (four) times daily. 06/14/17   Cathie Hoops, Amy  V, PA-C  oxymetazoline (AFRIN) 0.05 % nasal spray Place 1 spray into both nostrils daily as needed for congestion.    [provider]    Allergies    Patient has no known allergies.  Review of Systems   Review of Systems  All other systems reviewed and are negative.   Physical Exam Updated Vital Signs BP 115/72 (BP Location: Left Arm)   Pulse 83   Temp 98.6 F (37 C) (Oral)   Resp 18   Ht 5\' 7"  (1.702 m)   Wt 61.2 kg   LMP 06/05/2020   SpO2 100%   BMI 21.14 kg/m   Physical Exam Vitals and nursing note reviewed.  Constitutional:      General: She is not in acute distress.    Appearance: She is well-developed. She is not ill-appearing.  HENT:     Head: Normocephalic and atraumatic.     Right Ear: Tympanic membrane and ear canal normal.     Left Ear: Tympanic membrane and ear canal normal.     Mouth/Throat:     Mouth: Mucous membranes are moist.     Comments: Mild posterior oropharyngeal erythema without exudates or swelling.  Uvula midline, no trismus. Eyes:     Extraocular Movements: Extraocular movements intact.     Conjunctiva/sclera: Conjunctivae normal.     Pupils: Pupils are equal, round, and reactive to light.  Cardiovascular:     Rate and Rhythm: Normal rate and regular rhythm.  Pulmonary:     Effort: Pulmonary effort is normal. No respiratory distress.     Breath sounds: Normal breath sounds. No wheezing.  Abdominal:     Palpations: Abdomen is soft.  Musculoskeletal:     Cervical back: Normal range of motion and neck supple. No rigidity or tenderness.  Lymphadenopathy:     Cervical: No cervical adenopathy.  Skin:    General: Skin is warm.  Neurological:     Mental Status: She is alert.     Comments: Speech is fluent without aphasia.  Normal tone.  Cranial nerves grossly intact.  Spontaneous movement all extremities with normal coordination.  Psychiatric:        Behavior: Behavior normal.     ED Results / Procedures / Treatments    Labs (all labs ordered are listed, but only abnormal results are displayed) Labs Reviewed  SARS CORONAVIRUS 2 (TAT 6-24 HRS)    EKG None  Radiology No results found.  Procedures Procedures   Medications Ordered in ED Medications  acetaminophen (TYLENOL) tablet 1,000 mg (  1,000 mg Oral Given 06/19/20 1456)    ED Course  I have reviewed the triage vital signs and the nursing notes.  Pertinent labs & imaging results that were available during my care of the patient were reviewed by me and considered in my medical decision making (see chart for details).    MDM Rules/Calculators/A&P                          Patient here for evaluation of intermittent upper respiratory symptoms as well as headache.  Initially felt like seasonal allergies, with about 3 days of resolution, however symptoms returned.  She is having intermittent headaches as well.  No known COVID exposures.  She is afebrile and in no acute distress.  Examination is overall reassuring.  Recommend COVID swab.  Presentation seems most consistent with viral etiology versus allergic rhinitis/seasonal allergies.  Recommend symptomatic management, good oral hydration, antihistamines, Motrin and Tylenol for headache.  Recommend PCP follow-up.  Discharge no distress.  Patient is agreeable to plan.  Discussed results, findings, treatment and follow up. Patient advised of return precautions. Patient verbalized understanding and agreed with plan.  Final Clinical Impression(s) / ED Diagnoses Final diagnoses:  Bad headache  Viral upper respiratory tract infection    Rx / DC Orders ED Discharge Orders    None       Devita Nies, [place] N, PA-C 06/19/20 1542    Pricilla Loveless, MD 06/23/20 1531

## 2020-06-19 NOTE — Discharge Instructions (Addendum)
Please take a daytime allergy medication such as claritin or Zyrtec.  It is recommended that you use a saline nasal spray or Flonase. Drink plenty of water to help with headache. Alternate between Motrin and Tylenol every 4 hours to help with headache. Follow closely with your primary care provider. Isolate at home while awaiting your COVID test.

## 2020-06-19 NOTE — ED Triage Notes (Signed)
Pt c/o flu like sx x 8 days-NAD-steady gait

## 2020-06-20 LAB — SARS CORONAVIRUS 2 (TAT 6-24 HRS): SARS Coronavirus 2: NEGATIVE

## 2022-12-22 ENCOUNTER — Ambulatory Visit (HOSPITAL_COMMUNITY)
Admission: EM | Admit: 2022-12-22 | Discharge: 2022-12-22 | Disposition: A | Discharge status: Home / Self Care | Payer: Medicaid Other | Attending: Family Medicine | Admitting: Family Medicine

## 2022-12-22 ENCOUNTER — Ambulatory Visit (INDEPENDENT_AMBULATORY_CARE_PROVIDER_SITE_OTHER): Payer: Medicaid Other

## 2022-12-22 ENCOUNTER — Encounter (HOSPITAL_COMMUNITY): Payer: Self-pay

## 2022-12-22 DIAGNOSIS — S20211A Contusion of right front wall of thorax, initial encounter: Secondary | ICD-10-CM

## 2022-12-22 DIAGNOSIS — R0781 Pleurodynia: Secondary | ICD-10-CM | POA: Diagnosis not present

## 2022-12-22 MED ORDER — HYDROCODONE-ACETAMINOPHEN 5-325 MG PO TABS: 1.0000 | ORAL_TABLET | Freq: Four times a day (QID) | ORAL | 0 refills | Status: AC | PRN

## 2022-12-22 MED ORDER — NAPROXEN 500 MG PO TABS: 500.0000 mg | ORAL_TABLET | Freq: Two times a day (BID) | ORAL | 0 refills | Status: AC

## 2022-12-22 NOTE — Discharge Instructions (Signed)
Be aware, you have been prescribed pain medications that may cause drowsiness. While taking this medication, do not take any other medications containing acetaminophen (Tylenol). Do not combine with alcohol or recreational drugs. Please do not drive, operate heavy machinery, or take part in activities that require making important decisions while on this medication as your judgement may be clouded.  

## 2022-12-22 NOTE — ED Provider Notes (Signed)
[facility]   [id number] 12/22/22 Arrival Time: 1219  ASSESSMENT & PLAN:  1. Rib contusion, right, initial encounter    I have personally viewed and independently interpreted the imaging studies ordered this visit. R Ribs with PA Chest: No obvious rib fx appreciated. No pneumothorax.  Begin: Discharge Medication List as of 12/22/2022  3:04 PM     START taking these medications   Details  HYDROcodone-acetaminophen (NORCO/VICODIN) 5-325 MG tablet Take 1 tablet by mouth every 6 (six) hours as needed for moderate pain (pain score 4-6) or severe pain (pain score 7-10)., Starting Tue 12/22/2022, Normal    naproxen (NAPROSYN) 500 MG tablet Take 1 tablet (500 mg total) by mouth 2 (two) times daily with a meal., Starting Tue 12/22/2022, Normal       Activities as tolerated. Orders Placed This Encounter  Procedures   DG Ribs Unilateral W/Chest Right   Work/school excuse note: provided. Recommend:  Follow-up Information     [facility]   Specialty: Urgent Care Why: If worsening or failing to improve as anticipated. Contact information: [address] [place] [address] [phone]               [place] Controlled Substances Registry consulted for this patient. I feel the risk/benefit ratio today is favorable for proceeding with this prescription for a controlled substance. Medication sedation precautions given.  Reviewed expectations re: course of current medical issues. Questions answered. Outlined signs and symptoms indicating need for more acute intervention. Patient verbalized understanding. After Visit Summary given.  SUBJECTIVE: History from: patient. Stacy [name] is a 42 y.o. female who reports falling last evening; onto R side; pain since. Denies SOB. Pain affecting sleep. Denies abd pain. Goody's powder without much relief.   Past Surgical History:  Procedure Laterality Date   CHOLECYSTECTOMY  7  years ago   ECTOPIC PREGNANCY SURGERY  2-3 years ago   just tube removal   NO PAST SURGERIES     SALPINGECTOMY  2008   right      OBJECTIVE:  Vitals:   12/22/22 1319  BP: 125/85  Pulse: 92  Resp: 18  Temp: 97.8 F (36.6 C)  SpO2: 97%    General appearance: alert; no distress HEENT: [place]; AT Neck: supple with FROM Resp: unlabored respirations Chest wall: vague TTP over R lower ribs (mostly mid-axillary); very slight bruising CV: brisk extremity capillary refill of bilateral UE; 2+ radial pulse of bilateral UE Skin: warm and dry; no visible rashes Neurologic: gait normal Psychological: alert and cooperative; normal mood and affect  Imaging: DG Ribs Unilateral W/Chest Right  Result Date: 12/22/2022 CLINICAL DATA:  Rib pain, status post fall EXAM: RIGHT RIBS AND CHEST - 3+ VIEW COMPARISON:  02/10/2018 chest radiograph FINDINGS: No displaced fracture or other bone lesions are seen involving the ribs. No pneumothorax or pleural effusion. No focal pulmonary opacity. Normal cardiac and mediastinal contours. IMPRESSION: No displaced rib fracture. Please note that radiographs can be insensitive to nondisplaced rib fractures. Electronically Signed   By: Wiliam Ke M.D.   On: 12/22/2022 16:18       No Known Allergies  Past Medical History:  Diagnosis Date   Alcoholism in recovery [facility])    Been in recovery since 2010   Hx of chlamydia infection    Hx of pyelonephritis    No pertinent past medical history    Normal pregnancy in third trimester 12/04/2013   Scoliosis    mild  Supervision of normal pregnancy 11/02/2010   SVD (spontaneous vaginal delivery) 12/05/2013   Vaginal Pap smear, abnormal    Social History   Socioeconomic History   Marital status: Single    Spouse name: Not on file   Number of children: Not on file   Years of education: Not on file   Highest education level: Not on file  Occupational History   Not on file  Tobacco Use   Smoking status: Every  Day    Current packs/day: 0.50    Average packs/day: 0.5 packs/day for 15.0 years (7.5 ttl pk-yrs)    Types: Cigarettes   Smokeless tobacco: Never  Vaping Use   Vaping status: Never Used  Substance and Sexual Activity   Alcohol use: Yes   Drug use: No   Sexual activity: Not on file  Other Topics Concern   Not on file  Social History Narrative   Not on file   Social Determinants of Health   Financial Resource Strain: Not on file  Food Insecurity: Not on file  Transportation Needs: Not on file  Physical Activity: Not on file  Stress: Not on file  Social Connections: Not on file   Family History  Problem Relation Age of Onset   Diabetes Father    Depression Paternal Grandfather    Past Surgical History:  Procedure Laterality Date   CHOLECYSTECTOMY  7 years ago   ECTOPIC PREGNANCY SURGERY  2-3 years ago   just tube removal   NO PAST SURGERIES     SALPINGECTOMY  2008   right       Mardella Layman, MD 12/22/22 [phone]

## 2022-12-22 NOTE — ED Triage Notes (Signed)
Patient reports that she opened a door last night and one of her dogs clipped her. Patient states she fell onto the corner of the sofa and is now having right rib cage area pain and the pain travels into her right shoulder,  Patient states she has taken a Goody's at 1115 today.

## 2023-03-14 ENCOUNTER — Encounter (HOSPITAL_BASED_OUTPATIENT_CLINIC_OR_DEPARTMENT_OTHER): Payer: Self-pay | Admitting: Emergency Medicine

## 2023-03-14 ENCOUNTER — Other Ambulatory Visit: Payer: Self-pay

## 2023-03-14 ENCOUNTER — Emergency Department (HOSPITAL_BASED_OUTPATIENT_CLINIC_OR_DEPARTMENT_OTHER): Payer: Medicaid Other

## 2023-03-14 ENCOUNTER — Emergency Department (HOSPITAL_BASED_OUTPATIENT_CLINIC_OR_DEPARTMENT_OTHER)
Admission: EM | Admit: 2023-03-14 | Discharge: 2023-03-14 | Disposition: A | Discharge status: Home / Self Care | Payer: Medicaid Other | Attending: Emergency Medicine | Admitting: Emergency Medicine

## 2023-03-14 DIAGNOSIS — Z20822 Contact with and (suspected) exposure to covid-19: Secondary | ICD-10-CM | POA: Diagnosis not present

## 2023-03-14 DIAGNOSIS — R0602 Shortness of breath: Secondary | ICD-10-CM | POA: Diagnosis not present

## 2023-03-14 DIAGNOSIS — J40 Bronchitis, not specified as acute or chronic: Secondary | ICD-10-CM | POA: Diagnosis not present

## 2023-03-14 DIAGNOSIS — R059 Cough, unspecified: Secondary | ICD-10-CM | POA: Diagnosis not present

## 2023-03-14 DIAGNOSIS — R079 Chest pain, unspecified: Secondary | ICD-10-CM | POA: Diagnosis not present

## 2023-03-14 LAB — RESP PANEL BY RT-PCR (RSV, FLU A&B, COVID)  RVPGX2
Influenza A by PCR: NEGATIVE
Influenza B by PCR: NEGATIVE
Resp Syncytial Virus by PCR: NEGATIVE
SARS Coronavirus 2 by RT PCR: NEGATIVE

## 2023-03-14 MED ORDER — AZITHROMYCIN 250 MG PO TABS: 250.0000 mg | ORAL_TABLET | Freq: Every day | ORAL | 0 refills | Status: AC

## 2023-03-14 MED ORDER — GUAIFENESIN-CODEINE 100-10 MG/5ML PO SOLN: 10.0000 mL | Freq: Three times a day (TID) | ORAL | 0 refills | Status: AC | PRN

## 2023-03-14 NOTE — ED Provider Notes (Signed)
[facility] AT [facility] Provider Note   CSN: [id number] Arrival date & time: 03/14/23  1647     History {Add pertinent medical, surgical, social history, OB history to HPI:1} Chief Complaint  Patient presents with   Cough    Stacy [name] is a 43 y.o. female.  She said she has been sick for a week.  Started with bodyaches fever.  Now has just a nonproductive cough especially at night keeps her up.  Nonproductive.  No vomiting or diarrhea.  She is a smoker.  The history is provided by the patient.  Cough Cough characteristics:  Non-productive Sputum characteristics:  Nondescript Severity:  Moderate Onset quality:  Gradual Duration:  1 week Timing:  Intermittent Progression:  Worsening Chronicity:  New Smoker: yes   Context: upper respiratory infection   Relieved by:  Nothing Worsened by:  Lying down Associated symptoms: fever, shortness of breath and sore throat   Associated symptoms: no chest pain        Home Medications Prior to Admission medications   Medication Sig Start Date End Date Taking? Authorizing Provider  calcium carbonate (TUMS - DOSED IN MG ELEMENTAL CALCIUM) 500 MG chewable tablet Chew 1 tablet by mouth 2 (two) times daily as needed for indigestion or heartburn.    [provider]  cetirizine (ZYRTEC) 10 MG tablet Take 1 tablet (10 mg total) by mouth daily. 06/14/17   Belinda Fisher, PA-C  HYDROcodone-acetaminophen (NORCO/VICODIN) 5-325 MG tablet Take 1 tablet by mouth every 6 (six) hours as needed for moderate pain (pain score 4-6) or severe pain (pain score 7-10). 12/22/22   Mardella Layman, MD  ibuprofen (ADVIL,MOTRIN) 800 MG tablet Take 1 tablet (800 mg total) by mouth every 8 (eight) hours as needed. Patient taking differently: Take 800 mg by mouth every 8 (eight) hours as needed for moderate pain.  07/11/16   Lawyer, Cristal Deer, PA-C  naproxen (NAPROSYN) 500 MG tablet Take 1 tablet (500 mg total) by mouth 2 (two)  times daily with a meal. 12/22/22   Mardella Layman, MD  oxymetazoline (AFRIN) 0.05 % nasal spray Place 1 spray into both nostrils daily as needed for congestion.    [provider]      Allergies    Patient has no known allergies.    Review of Systems   Review of Systems  Constitutional:  Positive for fever.  HENT:  Positive for sore throat.   Respiratory:  Positive for cough and shortness of breath.   Cardiovascular:  Negative for chest pain.  Gastrointestinal:  Negative for abdominal pain.    Physical Exam Updated Vital Signs BP (!) 140/95 (BP Location: Left Arm)   Pulse 93   Temp 98.6 F (37 C)   Resp 18   Wt 68 kg   LMP 03/07/2023 (Approximate)   SpO2 94%   BMI 23.49 kg/m  Physical Exam Vitals and nursing note reviewed.  Constitutional:      General: She is not in acute distress.    Appearance: Normal appearance. She is well-developed.  HENT:     Head: Normocephalic and atraumatic.  Eyes:     Conjunctiva/sclera: Conjunctivae normal.  Cardiovascular:     Rate and Rhythm: Normal rate and regular rhythm.     Heart sounds: No murmur heard. Pulmonary:     Effort: Pulmonary effort is normal. No respiratory distress.     Breath sounds: Examination of the left-lower field reveals rhonchi. Rhonchi present.  Abdominal:  Palpations: Abdomen is soft.     Tenderness: There is no abdominal tenderness.  Musculoskeletal:        General: No swelling.     Cervical back: Neck supple.  Skin:    General: Skin is warm and dry.     Capillary Refill: Capillary refill takes less than 2 seconds.  Neurological:     Mental Status: She is alert.  Psychiatric:        Mood and Affect: Mood normal.     ED Results / Procedures / Treatments   Labs (all labs ordered are listed, but only abnormal results are displayed) Labs Reviewed  RESP PANEL BY RT-PCR (RSV, FLU A&B, COVID)  RVPGX2    EKG None  Radiology No results found.  Procedures Procedures  {Document  cardiac monitor, telemetry assessment procedure when appropriate:1}  Medications Ordered in ED Medications - No data to display  ED Course/ Medical Decision Making/ A&P Clinical Course as of 03/14/23 1721  Sun Mar 14, 2023  1720 Chest x-ray does not show any acute infiltrate.  Awaiting radiology reading. [MB]    Clinical Course User Index [MB] Terrilee Files, MD   {   Click here for ABCD2, HEART and other calculatorsREFRESH Note before signing :1}                              Medical Decision Making Amount and/or Complexity of Data Reviewed Radiology: ordered.   This patient complains of ***; this involves an extensive number of treatment Options and is a complaint that carries with it a high risk of complications and morbidity. The differential includes ***  I ordered, reviewed and interpreted labs, which included *** I ordered medication *** and reviewed PMP when indicated. I ordered imaging studies which included *** and I independently    visualized and interpreted imaging which showed *** Additional history obtained from *** Previous records obtained and reviewed *** I consulted *** and discussed lab and imaging findings and discussed disposition.  Cardiac monitoring reviewed, *** Social determinants considered, *** Critical Interventions: ***  After the interventions stated above, I reevaluated the patient and found *** Admission and further testing considered, ***   {Document critical care time when appropriate:1} {Document review of labs and clinical decision tools ie heart score, Chads2Vasc2 etc:1}  {Document your independent review of radiology images, and any outside records:1} {Document your discussion with family members, caretakers, and with consultants:1} {Document social determinants of health affecting pt's care:1} {Document your decision making why or why not admission, treatments were needed:1} Final Clinical Impression(s) / ED Diagnoses Final  diagnoses:  None    Rx / DC Orders ED Discharge Orders     None

## 2023-03-14 NOTE — ED Triage Notes (Signed)
Cough x 1 week , chest pain and shortness of breath at nigh only when coughing

## 2024-01-30 ENCOUNTER — Other Ambulatory Visit: Payer: Self-pay

## 2024-01-30 ENCOUNTER — Encounter (HOSPITAL_BASED_OUTPATIENT_CLINIC_OR_DEPARTMENT_OTHER): Payer: Self-pay

## 2024-01-30 ENCOUNTER — Emergency Department (HOSPITAL_BASED_OUTPATIENT_CLINIC_OR_DEPARTMENT_OTHER)
Admission: EM | Admit: 2024-01-30 | Discharge: 2024-01-30 | Disposition: A | Discharge status: Home / Self Care | Attending: Emergency Medicine | Admitting: Emergency Medicine

## 2024-01-30 DIAGNOSIS — J0100 Acute maxillary sinusitis, unspecified: Secondary | ICD-10-CM

## 2024-01-30 DIAGNOSIS — F17200 Nicotine dependence, unspecified, uncomplicated: Secondary | ICD-10-CM | POA: Diagnosis not present

## 2024-01-30 DIAGNOSIS — H6123 Impacted cerumen, bilateral: Secondary | ICD-10-CM | POA: Diagnosis not present

## 2024-01-30 LAB — RESP PANEL BY RT-PCR (RSV, FLU A&B, COVID)  RVPGX2
Influenza A by PCR: NEGATIVE
Influenza B by PCR: NEGATIVE
Resp Syncytial Virus by PCR: NEGATIVE
SARS Coronavirus 2 by RT PCR: NEGATIVE

## 2024-01-30 MED ORDER — AMOXICILLIN-POT CLAVULANATE 875-125 MG PO TABS: 1.0000 | ORAL_TABLET | Freq: Two times a day (BID) | ORAL | 0 refills | Status: AC

## 2024-01-30 MED ORDER — ACETAMINOPHEN 325 MG PO TABS
650.0000 mg | ORAL_TABLET | Freq: Once | ORAL | Status: AC
  Administered 2024-01-30: 650 mg via ORAL
  Filled 2024-01-30: qty 2

## 2024-01-30 MED ORDER — FLUTICASONE PROPIONATE 50 MCG/ACT NA SUSP: 2.0000 | Freq: Every day | NASAL | 2 refills | Status: AC

## 2024-01-30 NOTE — Discharge Instructions (Signed)
 As we discussed, please call the [facility] which is our primary care service to establish care.  They can get you in for a mammogram.  I have also sent 2 prescriptions to your pharmacy.  Please take as prescribed.  I would highly advise you stop taking the Afrin this is likely causing your congestion on daily basis.  You may return to the emergency department for any worsening symptoms.

## 2024-01-30 NOTE — ED Notes (Signed)
 ED Provider at bedside.

## 2024-01-30 NOTE — ED Provider Notes (Signed)
 [facility] Provider Note   CSN: [id number] Arrival date & time: 01/30/24  9056     Patient presents with: Nasal Congestion   Stacy [name] is a 43 y.o. female with cc sinus congestion/pain. She states she has been having sinus congestion, cough, rhinorrhea, intermittent HA for 1 week. Friday she developed Right maxillary facial pain and swelling which has persisted. Pain ranges from right ear to right upper teeth and maxillary sinus. She occasionally produces yellow sputum from cough. She denies vision changes, fever, sore throat, SOB, chest pain, N/V/D. She has tried Motrin , Tylenol , goodies powder, and Sudafed with little relief. The pain is now keeping her awake at night.  Patient states that she has been taking Afrin for many years since she was pregnant.  She states that when she does not use this medication she is congested.  Also complaining of lymph node in the left axillary area.  She states that originally started in the right axillary area but now in the left.  Patient has never had a mammogram and does not have a primary care physician.  She has not noticed any abnormal nipple discharge or lumps in the breast.  HPI     Prior to Admission medications   Medication Sig Start Date End Date Taking? Authorizing Provider  amoxicillin -clavulanate (AUGMENTIN ) 875-125 MG tablet Take 1 tablet by mouth every 12 (twelve) hours. 01/30/24  Yes Theotis, Anedra Penafiel M, PA-C  fluticasone  (FLONASE ) 50 MCG/ACT nasal spray Place 2 sprays into both nostrils daily. 01/30/24  Yes Theotis, Oberon Hehir M, PA-C  azithromycin  (ZITHROMAX ) 250 MG tablet Take 1 tablet (250 mg total) by mouth daily. Take first 2 tablets together, then 1 every day until finished. 03/14/23   Butler, Michael C, MD  calcium  carbonate (TUMS - DOSED IN MG ELEMENTAL CALCIUM ) 500 MG chewable tablet Chew 1 tablet by mouth 2 (two) times daily as needed for indigestion or heartburn.    [provider]  cetirizine  (ZYRTEC ) 10 MG tablet Take 1 tablet (10 mg total) by mouth daily. 06/14/17   Babara, Amy V, PA-C  guaiFENesin -codeine  100-10 MG/5ML syrup Take 10 mLs by mouth 3 (three) times daily as needed for cough. 03/14/23   Towana Ozell BROCKS, MD  HYDROcodone -acetaminophen  (NORCO/VICODIN) 5-325 MG tablet Take 1 tablet by mouth every 6 (six) hours as needed for moderate pain (pain score 4-6) or severe pain (pain score 7-10). 12/22/22   Rolinda Rogue, MD  ibuprofen  (ADVIL ,MOTRIN ) 800 MG tablet Take 1 tablet (800 mg total) by mouth every 8 (eight) hours as needed. Patient taking differently: Take 800 mg by mouth every 8 (eight) hours as needed for moderate pain.  07/11/16   Lawyer, Lonni, PA-C  naproxen  (NAPROSYN ) 500 MG tablet Take 1 tablet (500 mg total) by mouth 2 (two) times daily with a meal. 12/22/22   Rolinda Rogue, MD  oxymetazoline  (AFRIN) 0.05 % nasal spray Place 1 spray into both nostrils daily as needed for congestion.    [provider]    Allergies: Patient has no known allergies.    Review of Systems  All other systems reviewed and are negative.   Updated Vital Signs BP (!) 156/90   Pulse (!) 108   Temp 98.9 F (37.2 C) (Oral)   Resp 18   LMP 01/23/2024 (Approximate)   SpO2 97%   Physical Exam Vitals and nursing note reviewed.  Constitutional:      General: She is not in acute distress.  Appearance: Normal appearance.  HENT:     Head: Normocephalic and atraumatic.     Ears:     Comments: External auditory canal does show cerumen bilaterally.  TMs are normal. Eyes:     General:        Right eye: No discharge.        Left eye: No discharge.  Cardiovascular:     Comments: Regular rate and rhythm.  S1/S2 are distinct without any evidence of murmur, rubs, or gallops.  Radial pulses are 2+ bilaterally.  Dorsalis pedis pulses are 2+ bilaterally.  No evidence of pedal edema. Pulmonary:     Comments: Clear to auscultation bilaterally.  Normal  effort.  No respiratory distress.  No evidence of wheezes, rales, or rhonchi heard throughout. Chest:     Comments: There is some left axillary lymphadenopathy.  There is a larger lymph node that is more firm and nonmobile.  There is a secondary lymph node that is smaller. No obvious breast mass.  Abdominal:     General: Abdomen is flat. Bowel sounds are normal. There is no distension.     Tenderness: There is no abdominal tenderness. There is no guarding or rebound.  Musculoskeletal:        General: Normal range of motion.     Cervical back: Neck supple.  Skin:    General: Skin is warm and dry.     Findings: No rash.  Neurological:     General: No focal deficit present.     Mental Status: She is alert.  Psychiatric:        Mood and Affect: Mood normal.        Behavior: Behavior normal.     (all labs ordered are listed, but only abnormal results are displayed) Labs Reviewed  RESP PANEL BY RT-PCR (RSV, FLU A&B, COVID)  [id number]    EKG: None  Radiology: No results found.   Procedures   Medications Ordered in the ED  acetaminophen  (TYLENOL ) tablet 650 mg (650 mg Oral Given 01/30/24 1100)    Clinical Course as of 01/30/24 1103  Sun Jan 30, 2024  1057 Resp panel by RT-PCR (RSV, Flu A&B, Covid) Anterior Nasal Swab Negative. [CF]  1057 On repeat evaluation, I went and looked at the left axillary lymph node.  Somewhat concerning for more cyst or underlying process.  Could be related to the sinus congestion as she did have it on the right and that resolved and she had this feels similar.  I will go ahead and give her referral to primary care to get established and mammogram she is never had 1.  She does not have any obvious lumps or masses.  With regards to the patient's nasal congestion I educated her on appropriate Afrin use and she will stop using this.  We discussed that she will get rebound congestion this will resolve with time.  Will switch her to Flonase  and give her Augmentin   for sinus infection.  Patient agreeable with plan. [CF]    Clinical Course User Index [CF] Theotis Cameron HERO, PA-C    Medical Decision Making This patient presents to the ED for concern of nasal congestion, this involves an extensive number of treatment options, and is a complaint that carries with it a high risk of complications and morbidity.  The differential diagnosis includes sinusitis bacterial versus viral, less suspicious for RPA or PTA.   Co morbidities that complicate the patient evaluation  Tobacco use   Additional history obtained:  Additional  history and/or information obtained from chart review.   Lab Tests:  I Ordered, and personally interpreted labs.  The pertinent results include: Respiratory panel which was negative.   Imaging Studies ordered:  Normal   Cardiac Monitoring:  The patient was maintained on a cardiac monitor.  I personally viewed and interpreted the cardiac monitored which showed an underlying rhythm of: Normal sinus rhythm   Medicines ordered and prescription drug management:  I ordered medication including Tylenol  for pain Reevaluation of the patient after these medicines showed that the patient improved I have reviewed the patients home medicines and have made adjustments as needed   Test Considered:  None   Critical Interventions:  Pain control   Consultations Obtained:     Problem List / ED Course:  See ED clinical course   Reevaluation:  After the interventions noted above, I reevaluated the patient and found that they have :improved   Social Determinants of Health:  Smoking Married   Disposition:  After consideration of the diagnostic results and the patients response to treatment, I feel that the patient would benefit from discharge with primary care follow-up.  Prescriptions sent to pharmacy.  Stable for discharge at this time.   Amount and/or Complexity of Data Reviewed Labs:  Decision-making  details documented in ED Course.  Risk OTC drugs.     Final diagnoses:  Acute non-recurrent maxillary sinusitis    ED Discharge Orders          Ordered    amoxicillin -clavulanate (AUGMENTIN ) 875-125 MG tablet  Every 12 hours        01/30/24 1102    fluticasone  (FLONASE ) 50 MCG/ACT nasal spray  Daily        01/30/24 [id number] [place], [place] 01/30/24 1103    Dreama Longs, MD 02/01/24 [phone]

## 2024-01-30 NOTE — ED Triage Notes (Signed)
 Reports R sided sinus pressure, cough, congestion for 1 week.
